# Patient Record
Sex: Male | Born: 1935 | Race: White | Hispanic: No | Marital: Married
Health system: Southern US, Community
[De-identification: ages and names within clinical notes are randomized; demographics above are authoritative.]

## PROBLEM LIST (undated history)

## (undated) DIAGNOSIS — E039 Hypothyroidism, unspecified: Secondary | ICD-10-CM

## (undated) DIAGNOSIS — Z951 Presence of aortocoronary bypass graft: Secondary | ICD-10-CM

## (undated) DIAGNOSIS — I4891 Unspecified atrial fibrillation: Secondary | ICD-10-CM

## (undated) DIAGNOSIS — K56609 Unspecified intestinal obstruction, unspecified as to partial versus complete obstruction: Secondary | ICD-10-CM

## (undated) DIAGNOSIS — I10 Essential (primary) hypertension: Secondary | ICD-10-CM

## (undated) DIAGNOSIS — I82409 Acute embolism and thrombosis of unspecified deep veins of unspecified lower extremity: Secondary | ICD-10-CM

## (undated) DIAGNOSIS — H3530 Unspecified macular degeneration: Secondary | ICD-10-CM

## (undated) HISTORY — PX: APPENDECTOMY: SHX54

## (undated) HISTORY — PX: CORONARY ARTERY BYPASS GRAFT: SHX141

---

## 2008-08-30 ENCOUNTER — Ambulatory Visit: Payer: Self-pay | Admitting: Diagnostic Radiology

## 2008-08-30 ENCOUNTER — Emergency Department (HOSPITAL_BASED_OUTPATIENT_CLINIC_OR_DEPARTMENT_OTHER): Admission: EM | Admit: 2008-08-30 | Discharge: 2008-08-30 | Payer: Self-pay | Admitting: Emergency Medicine

## 2010-04-21 LAB — DIFFERENTIAL
Lymphs Abs: 1.3 10*3/uL (ref 0.7–4.0)
Monocytes Absolute: 0.6 10*3/uL (ref 0.1–1.0)
Monocytes Relative: 8 % (ref 3–12)
Neutro Abs: 4.7 10*3/uL (ref 1.7–7.7)
Neutrophils Relative %: 70 % (ref 43–77)

## 2010-04-21 LAB — URINALYSIS, ROUTINE W REFLEX MICROSCOPIC
Bilirubin Urine: NEGATIVE
Glucose, UA: NEGATIVE mg/dL
Ketones, ur: NEGATIVE mg/dL
Leukocytes, UA: NEGATIVE
pH: 6 (ref 5.0–8.0)

## 2010-04-21 LAB — POCT B-TYPE NATRIURETIC PEPTIDE (BNP): B Natriuretic Peptide, POC: 127 pg/mL — ABNORMAL HIGH (ref 0–100)

## 2010-04-21 LAB — URINE MICROSCOPIC-ADD ON

## 2010-04-21 LAB — COMPREHENSIVE METABOLIC PANEL
ALT: 21 U/L (ref 0–53)
Albumin: 4 g/dL (ref 3.5–5.2)
BUN: 15 mg/dL (ref 6–23)
Calcium: 9.2 mg/dL (ref 8.4–10.5)
Glucose, Bld: 90 mg/dL (ref 70–99)
Potassium: 4.1 mEq/L (ref 3.5–5.1)
Sodium: 140 mEq/L (ref 135–145)
Total Protein: 7.1 g/dL (ref 6.0–8.3)

## 2010-04-21 LAB — CBC
Hemoglobin: 15.6 g/dL (ref 13.0–17.0)
MCHC: 33.2 g/dL (ref 30.0–36.0)
Platelets: 174 10*3/uL (ref 150–400)
RDW: 14 % (ref 11.5–15.5)

## 2010-04-21 LAB — POCT CARDIAC MARKERS
CKMB, poc: 1.4 ng/mL (ref 1.0–8.0)
Myoglobin, poc: 73.5 ng/mL (ref 12–200)
Myoglobin, poc: 83.5 ng/mL (ref 12–200)

## 2010-04-21 LAB — GLUCOSE, CAPILLARY: Glucose-Capillary: 102 mg/dL — ABNORMAL HIGH (ref 70–99)

## 2010-04-21 LAB — APTT: aPTT: 31 seconds (ref 24–37)

## 2010-04-21 LAB — PROTIME-INR: INR: 3.1 — ABNORMAL HIGH (ref 0.00–1.49)

## 2012-06-07 ENCOUNTER — Emergency Department (HOSPITAL_BASED_OUTPATIENT_CLINIC_OR_DEPARTMENT_OTHER): Payer: Medicare Other

## 2012-06-07 ENCOUNTER — Emergency Department (HOSPITAL_BASED_OUTPATIENT_CLINIC_OR_DEPARTMENT_OTHER)
Admission: EM | Admit: 2012-06-07 | Discharge: 2012-06-08 | Disposition: A | Discharge status: Home / Self Care | Payer: Medicare Other | Attending: Emergency Medicine | Admitting: Emergency Medicine

## 2012-06-07 ENCOUNTER — Encounter (HOSPITAL_BASED_OUTPATIENT_CLINIC_OR_DEPARTMENT_OTHER): Payer: Self-pay | Admitting: *Deleted

## 2012-06-07 DIAGNOSIS — I10 Essential (primary) hypertension: Secondary | ICD-10-CM | POA: Insufficient documentation

## 2012-06-07 DIAGNOSIS — Z951 Presence of aortocoronary bypass graft: Secondary | ICD-10-CM | POA: Insufficient documentation

## 2012-06-07 DIAGNOSIS — E039 Hypothyroidism, unspecified: Secondary | ICD-10-CM | POA: Insufficient documentation

## 2012-06-07 DIAGNOSIS — Y9289 Other specified places as the place of occurrence of the external cause: Secondary | ICD-10-CM | POA: Insufficient documentation

## 2012-06-07 DIAGNOSIS — S79919A Unspecified injury of unspecified hip, initial encounter: Secondary | ICD-10-CM | POA: Insufficient documentation

## 2012-06-07 DIAGNOSIS — Z79899 Other long term (current) drug therapy: Secondary | ICD-10-CM | POA: Insufficient documentation

## 2012-06-07 DIAGNOSIS — S99929A Unspecified injury of unspecified foot, initial encounter: Secondary | ICD-10-CM | POA: Insufficient documentation

## 2012-06-07 DIAGNOSIS — I4891 Unspecified atrial fibrillation: Secondary | ICD-10-CM | POA: Insufficient documentation

## 2012-06-07 DIAGNOSIS — Y9389 Activity, other specified: Secondary | ICD-10-CM | POA: Insufficient documentation

## 2012-06-07 DIAGNOSIS — Z7901 Long term (current) use of anticoagulants: Secondary | ICD-10-CM | POA: Insufficient documentation

## 2012-06-07 DIAGNOSIS — S8990XA Unspecified injury of unspecified lower leg, initial encounter: Secondary | ICD-10-CM | POA: Insufficient documentation

## 2012-06-07 DIAGNOSIS — Z86718 Personal history of other venous thrombosis and embolism: Secondary | ICD-10-CM | POA: Insufficient documentation

## 2012-06-07 DIAGNOSIS — Z8669 Personal history of other diseases of the nervous system and sense organs: Secondary | ICD-10-CM | POA: Insufficient documentation

## 2012-06-07 DIAGNOSIS — S3981XA Other specified injuries of abdomen, initial encounter: Secondary | ICD-10-CM | POA: Insufficient documentation

## 2012-06-07 DIAGNOSIS — T148XXA Other injury of unspecified body region, initial encounter: Secondary | ICD-10-CM | POA: Insufficient documentation

## 2012-06-07 DIAGNOSIS — W010XXA Fall on same level from slipping, tripping and stumbling without subsequent striking against object, initial encounter: Secondary | ICD-10-CM | POA: Insufficient documentation

## 2012-06-07 DIAGNOSIS — N289 Disorder of kidney and ureter, unspecified: Secondary | ICD-10-CM | POA: Insufficient documentation

## 2012-06-07 DIAGNOSIS — I498 Other specified cardiac arrhythmias: Secondary | ICD-10-CM | POA: Insufficient documentation

## 2012-06-07 DIAGNOSIS — Z87891 Personal history of nicotine dependence: Secondary | ICD-10-CM | POA: Insufficient documentation

## 2012-06-07 HISTORY — DX: Unspecified macular degeneration: H35.30

## 2012-06-07 HISTORY — DX: Unspecified atrial fibrillation: I48.91

## 2012-06-07 HISTORY — DX: Presence of aortocoronary bypass graft: Z95.1

## 2012-06-07 HISTORY — DX: Essential (primary) hypertension: I10

## 2012-06-07 HISTORY — DX: Hypothyroidism, unspecified: E03.9

## 2012-06-07 HISTORY — DX: Acute embolism and thrombosis of unspecified deep veins of unspecified lower extremity: I82.409

## 2012-06-07 LAB — CBC WITH DIFFERENTIAL/PLATELET
Basophils Relative: 1 % (ref 0–1)
Eosinophils Absolute: 0.1 10*3/uL (ref 0.0–0.7)
Hemoglobin: 14.6 g/dL (ref 13.0–17.0)
MCH: 31 pg (ref 26.0–34.0)
MCHC: 34.7 g/dL (ref 30.0–36.0)
Monocytes Relative: 10 % (ref 3–12)
Neutrophils Relative %: 68 % (ref 43–77)
RDW: 14.4 % (ref 11.5–15.5)

## 2012-06-07 NOTE — ED Notes (Signed)
Pt states that he fell yesterday. States he thinks he slipped and fell hurting his right hip. Denies loc with fall. States he hit his right hip area. brusing noted to right side of abdomen. C/o soreness to hip area. States that tonight around 7pm he had a "funny" feeling on the right side of his face. Denies any difficulty with speech or any weakness. States that it feels better at present. Neuro exam negative on assessment. Grips equal bilateral and speech clear.

## 2012-06-07 NOTE — ED Provider Notes (Signed)
History  This chart was scribed for Edward [place] Smitty Cords, MD by [name], ED Scribe. This patient was seen in room MH02/MH02 and the patient's care was started at 11:46 PM.  CSN: [id number]  Arrival date & time 06/07/12  2215    Chief Complaint  Patient presents with  . Fall    Patient is a 79 y.o. male presenting with fall. The history is provided by the patient. No language interpreter was used.  Fall This is a new problem. The current episode started 2 days ago. The problem occurs constantly. The problem has not changed since onset.Pertinent negatives include no chest pain, no abdominal pain, no headaches and no shortness of breath. Nothing aggravates the symptoms. Nothing relieves the symptoms. He has tried nothing for the symptoms. The treatment provided no relief.    HPI Comments: Edward Fitzpatrick is a 79 y.o. male who presents to the Emergency Department complaining of a fall that happened Friday afternoon. Pt states he landed on his right side and rolled back onto his left side and he states he also hit his head. Pt states the pain is worse from his knee to his hip. Pt denies fever, neck pain, sore throat, visual disturbance, CP, cough, SOB, abdominal pain, nausea, emesis, diarrhea, urinary symptoms, back pain, HA, weakness, numbness and rash as associated symptoms. Pt states he does not want pain medication right now.   Past Medical History  Diagnosis Date  . Atrial fibrillation   . S/P CABG x 1   . DVT (deep venous thrombosis)   . Hypertension   . Hypothyroid   . Macular degeneration     Past Surgical History  Procedure Laterality Date  . Coronary artery bypass graft    . Appendectomy      No family history on file.  History  Substance Use Topics  . Smoking status: Former Games developer  . Smokeless tobacco: Not on file  . Alcohol Use: Yes     Comment: occasional       Review of Systems  Constitutional: Negative for fever.  HENT: Negative for sore throat and  neck pain.   Eyes: Negative for visual disturbance.  Respiratory: Negative for cough and shortness of breath.   Cardiovascular: Negative for chest pain.  Gastrointestinal: Negative for nausea, vomiting, abdominal pain and diarrhea.  Musculoskeletal: Negative for back pain.  Skin: Negative for rash.  Neurological: Negative for weakness, numbness and headaches.  All other systems reviewed and are negative.    Allergies  Review of patient's allergies indicates not on file.  Home Medications   Current Outpatient Rx  Name  Route  Sig  Dispense  Refill  . amLODipine (NORVASC) 5 MG tablet   Oral   Take 5 mg by mouth daily.         . furosemide (LASIX) 20 MG tablet   Oral   Take 20 mg by mouth 2 (two) times daily.         Marland Kitchen levothyroxine (SYNTHROID, LEVOTHROID) 25 MCG tablet   Oral   Take 25 mcg by mouth daily before breakfast.         . omeprazole (PRILOSEC) 40 MG capsule   Oral   Take 40 mg by mouth daily.         Marland Kitchen warfarin (COUMADIN) 4 MG tablet   Oral   Take 4 mg by mouth daily.           BP 148/74  Pulse 71  Temp(Src) 97.9 F (36.6 C) (  Oral)  Resp 16  SpO2 98%  Physical Exam  Nursing note and vitals reviewed. Constitutional: He is oriented to person, place, and time. He appears well-developed and well-nourished.  HENT:  Head: Normocephalic and atraumatic. Head is without raccoon's eyes and without Battle's sign.  Right Ear: No mastoid tenderness. No hemotympanum.  Left Ear: No mastoid tenderness. No hemotympanum.  Mouth/Throat: Oropharynx is clear and moist.  Eyes: Pupils are equal, round, and reactive to light.  Lens implant in left eye.  Neck: Normal range of motion.  Cardiovascular: Normal rate, regular rhythm and intact distal pulses.   Bradycardia but regular.  Pulmonary/Chest: Effort normal and breath sounds normal. He has no wheezes. He has no rales.  Abdominal: Soft. Bowel sounds are normal. He exhibits no distension. There is no  tenderness. There is no rebound and no guarding.  Musculoskeletal: Normal range of motion. He exhibits no tenderness.  No C-spine tenderness. Bruising over iliac crest on the right. Intact distal pulses. No step offs. No crepitance. No bruising to the back.   Neurological: He is alert and oriented to person, place, and time. He has normal reflexes.  Skin: Skin is warm and dry.  Psychiatric: He has a normal mood and affect.    ED Course  Procedures (including critical care time)  DIAGNOSTIC STUDIES: Oxygen Saturation is 98% on RA, normal by my interpretation.    COORDINATION OF CARE: 12:02 AM-Discussed treatment plan with pt at bedside and pt agreed to plan.   Labs Reviewed  BASIC METABOLIC PANEL - Abnormal; Notable for the following:    Glucose, Bld 103 (*)    GFR calc non Af Amer 63 (*)    GFR calc Af Amer 73 (*)    All other components within normal limits  PROTIME-INR - Abnormal; Notable for the following:    Prothrombin Time 22.0 (*)    INR 2.01 (*)    All other components within normal limits  CBC WITH DIFFERENTIAL   Dg Pelvis 1-2 Views  06/08/2012   *RADIOLOGY REPORT*  Clinical Data: Status post fall; right hip pain.  PELVIS - 1-2 VIEW  Comparison: CTA of the abdomen and pelvis performed 08/30/2008  Findings: There is no evidence of fracture or dislocation.  Both femoral heads are seated normally within their respective acetabula.  Degenerative change is noted along the lower lumbar spine, and mild sclerotic change is seen at the sacroiliac joints.  The visualized bowel gas pattern is grossly unremarkable in appearance.  Scattered vascular calcifications are seen.  IMPRESSION:  1.  No evidence of fracture or dislocation. 2.  Scattered vascular calcifications seen.   Original Report Authenticated By: Tonia Ghent, M.D.   Ct Head Wo Contrast  06/08/2012   *RADIOLOGY REPORT*  Clinical Data:  Fall  CT HEAD WITHOUT CONTRAST CT CERVICAL SPINE WITHOUT CONTRAST  Technique:   Multidetector CT imaging of the head and cervical spine was performed following the standard protocol without intravenous contrast.  Multiplanar CT image reconstructions of the cervical spine were also generated.  Comparison:   None  CT HEAD  Findings:  No acute intracranial hemorrhage.  No focal mass lesion. No CT evidence of acute infarction.   No midline shift or mass effect.  No hydrocephalus.  Basilar cisterns are patent.  There is generalized cortical atrophy.  There is mild periventricular and deep white matter hypodensities.  Paranasal sinuses and mastoid air cells are clear.  Orbits are normal.  IMPRESSION:  1.  No acute intracranial trauma. 2.  Atrophy and microvascular disease.  CT CERVICAL SPINE  Findings: No prevertebral soft tissue swelling.  Normal alignment of cervical vertebral bodies.  No loss of vertebral body height. Normal facet articulation.  Normal craniocervical junction.  No evidence epidural or paraspinal hematoma.  There is mild endplate spurring and joint space narrowing.  IMPRESSION:  1.  No evidence of cervical spine fracture. 2.  Multilevel disc osteophytic disease .   Original Report Authenticated By: Genevive Bi, M.D.   Ct Cervical Spine Wo Contrast  06/08/2012   *RADIOLOGY REPORT*  Clinical Data:  Fall  CT HEAD WITHOUT CONTRAST CT CERVICAL SPINE WITHOUT CONTRAST  Technique:  Multidetector CT imaging of the head and cervical spine was performed following the standard protocol without intravenous contrast.  Multiplanar CT image reconstructions of the cervical spine were also generated.  Comparison:   None  CT HEAD  Findings:  No acute intracranial hemorrhage.  No focal mass lesion. No CT evidence of acute infarction.   No midline shift or mass effect.  No hydrocephalus.  Basilar cisterns are patent.  There is generalized cortical atrophy.  There is mild periventricular and deep white matter hypodensities.  Paranasal sinuses and mastoid air cells are clear.  Orbits are normal.   IMPRESSION:  1.  No acute intracranial trauma. 2.  Atrophy and microvascular disease.  CT CERVICAL SPINE  Findings: No prevertebral soft tissue swelling.  Normal alignment of cervical vertebral bodies.  No loss of vertebral body height. Normal facet articulation.  Normal craniocervical junction.  No evidence epidural or paraspinal hematoma.  There is mild endplate spurring and joint space narrowing.  IMPRESSION:  1.  No evidence of cervical spine fracture. 2.  Multilevel disc osteophytic disease .   Original Report Authenticated By: Genevive Bi, M.D.     No diagnosis found.    MDM  No trauma.  Findings of CT discussed with patient.  Patient will need to follow up with PMD for outpatient [place] to better identify renal lesion seen on CT scan Patient verbalizes understanding and agrees to follow up     I personally performed the services described in this documentation, which was scribed in my presence. The recorded information has been reviewed and is accurate.    Jasmine Awe, MD 06/08/12 [phone]

## 2012-06-08 ENCOUNTER — Encounter (HOSPITAL_BASED_OUTPATIENT_CLINIC_OR_DEPARTMENT_OTHER): Payer: Self-pay

## 2012-06-08 ENCOUNTER — Emergency Department (HOSPITAL_BASED_OUTPATIENT_CLINIC_OR_DEPARTMENT_OTHER): Payer: Medicare Other

## 2012-06-08 LAB — BASIC METABOLIC PANEL
BUN: 19 mg/dL (ref 6–23)
Creatinine, Ser: 1 mg/dL (ref 0.50–1.35)
GFR calc Af Amer: 73 mL/min — ABNORMAL LOW (ref >90)
GFR calc non Af Amer: 63 mL/min — ABNORMAL LOW (ref >90)
Potassium: 3.9 mEq/L (ref 3.5–5.1)

## 2012-06-08 MED ORDER — IOHEXOL 300 MG/ML  SOLN
100.0000 mL | Freq: Once | INTRAMUSCULAR | Status: AC | PRN
  Administered 2012-06-08: 100 mL via INTRAVENOUS

## 2012-07-08 ENCOUNTER — Emergency Department (HOSPITAL_BASED_OUTPATIENT_CLINIC_OR_DEPARTMENT_OTHER): Payer: Medicare Other

## 2012-07-08 ENCOUNTER — Emergency Department (HOSPITAL_BASED_OUTPATIENT_CLINIC_OR_DEPARTMENT_OTHER)
Admission: EM | Admit: 2012-07-08 | Discharge: 2012-07-08 | Disposition: A | Discharge status: Home / Self Care | Payer: Medicare Other | Attending: Emergency Medicine | Admitting: Emergency Medicine

## 2012-07-08 ENCOUNTER — Encounter (HOSPITAL_BASED_OUTPATIENT_CLINIC_OR_DEPARTMENT_OTHER): Payer: Self-pay | Admitting: *Deleted

## 2012-07-08 DIAGNOSIS — S40019A Contusion of unspecified shoulder, initial encounter: Secondary | ICD-10-CM | POA: Insufficient documentation

## 2012-07-08 DIAGNOSIS — Y9289 Other specified places as the place of occurrence of the external cause: Secondary | ICD-10-CM | POA: Insufficient documentation

## 2012-07-08 DIAGNOSIS — Z86718 Personal history of other venous thrombosis and embolism: Secondary | ICD-10-CM | POA: Insufficient documentation

## 2012-07-08 DIAGNOSIS — Z79899 Other long term (current) drug therapy: Secondary | ICD-10-CM | POA: Insufficient documentation

## 2012-07-08 DIAGNOSIS — Z7901 Long term (current) use of anticoagulants: Secondary | ICD-10-CM | POA: Insufficient documentation

## 2012-07-08 DIAGNOSIS — Y93B9 Activity, other involving muscle strengthening exercises: Secondary | ICD-10-CM | POA: Insufficient documentation

## 2012-07-08 DIAGNOSIS — Z87891 Personal history of nicotine dependence: Secondary | ICD-10-CM | POA: Insufficient documentation

## 2012-07-08 DIAGNOSIS — I10 Essential (primary) hypertension: Secondary | ICD-10-CM | POA: Insufficient documentation

## 2012-07-08 DIAGNOSIS — Z951 Presence of aortocoronary bypass graft: Secondary | ICD-10-CM | POA: Insufficient documentation

## 2012-07-08 DIAGNOSIS — E039 Hypothyroidism, unspecified: Secondary | ICD-10-CM | POA: Insufficient documentation

## 2012-07-08 DIAGNOSIS — Z8669 Personal history of other diseases of the nervous system and sense organs: Secondary | ICD-10-CM | POA: Insufficient documentation

## 2012-07-08 DIAGNOSIS — R296 Repeated falls: Secondary | ICD-10-CM | POA: Insufficient documentation

## 2012-07-08 DIAGNOSIS — I4891 Unspecified atrial fibrillation: Secondary | ICD-10-CM | POA: Insufficient documentation

## 2012-07-08 DIAGNOSIS — S40011A Contusion of right shoulder, initial encounter: Secondary | ICD-10-CM

## 2012-07-08 NOTE — ED Provider Notes (Signed)
History    CSN: [id number] Arrival date & time 07/08/12  7425  First MD Initiated Contact with Patient 07/08/12 1845     Chief Complaint  Patient presents with  . Fall   (Consider location/radiation/quality/duration/timing/severity/associated sxs/prior Treatment) HPI While doing balance exercises pt fell from standing position onto R shoulder. No head or neck injury. C/o mild pain at shoulder. No numbness or weakness. Pt last INR was 1.7 Past Medical History  Diagnosis Date  . Atrial fibrillation   . S/P CABG x 1   . DVT (deep venous thrombosis)   . Hypertension   . Hypothyroid   . Macular degeneration    Past Surgical History  Procedure Laterality Date  . Coronary artery bypass graft    . Appendectomy     History reviewed. No pertinent family history. History  Substance Use Topics  . Smoking status: Former Games developer  . Smokeless tobacco: Not on file  . Alcohol Use: Yes     Comment: occasional     Review of Systems  Constitutional: Negative for fever and chills.  HENT: Negative for neck pain and neck stiffness.   Respiratory: Negative for shortness of breath.   Cardiovascular: Negative for chest pain, palpitations and leg swelling.  Gastrointestinal: Negative for nausea, vomiting and abdominal pain.  Musculoskeletal: Positive for arthralgias. Negative for back pain.  Skin: Positive for wound. Negative for rash.  Neurological: Negative for dizziness, syncope, weakness, light-headedness, numbness and headaches.  All other systems reviewed and are negative.    Allergies  Review of patient's allergies indicates no known allergies.  Home Medications   Current Outpatient Rx  Name  Route  Sig  Dispense  Refill  . atorvastatin (LIPITOR) 20 MG tablet   Oral   Take 20 mg by mouth daily.         . valsartan-hydrochlorothiazide (DIOVAN-HCT) 160-25 MG per tablet   Oral   Take 1 tablet by mouth daily.         Marland Kitchen warfarin (COUMADIN) 1 MG tablet   Oral   Take 1 mg  by mouth as directed.         Marland Kitchen amLODipine (NORVASC) 5 MG tablet   Oral   Take 5 mg by mouth daily.         . furosemide (LASIX) 20 MG tablet   Oral   Take 20 mg by mouth 2 (two) times daily.         Marland Kitchen levothyroxine (SYNTHROID, LEVOTHROID) 25 MCG tablet   Oral   Take 25 mcg by mouth daily before breakfast.         . omeprazole (PRILOSEC) 40 MG capsule   Oral   Take 40 mg by mouth daily.         Marland Kitchen warfarin (COUMADIN) 4 MG tablet   Oral   Take 4 mg by mouth daily.          BP 177/76  Pulse 71  Temp(Src) 98.3 F (36.8 C) (Oral)  Resp 18  Ht 5' 7.5" (1.715 m)  Wt 185 lb (83.915 kg)  BMI 28.53 kg/m2  SpO2 100% Physical Exam  Nursing note and vitals reviewed. Constitutional: He is oriented to person, place, and time. He appears well-developed and well-nourished. No distress.  HENT:  Head: Normocephalic and atraumatic.  Mouth/Throat: Oropharynx is clear and moist.  Eyes: EOM are normal. Pupils are equal, round, and reactive to light.  Neck: Normal range of motion. Neck supple.  No posterior cervical TTP  Cardiovascular:  Normal rate and regular rhythm.   Pulmonary/Chest: Effort normal and breath sounds normal. No respiratory distress. He has no wheezes. He has no rales. He exhibits no tenderness.  Abdominal: Soft. Bowel sounds are normal. He exhibits no distension. There is no tenderness. There is no rebound and no guarding.  Musculoskeletal: Normal range of motion. He exhibits tenderness (Mild tenderness at R shoulder. No swelling or deformity. 2+ radial pulses. ). He exhibits no edema.  Neurological: He is alert and oriented to person, place, and time.  5/5 motor in all ext, sensation intact  Skin: Skin is warm and dry. No rash noted. No erythema.  Psychiatric: He has a normal mood and affect. His behavior is normal.    ED Course  Procedures (including critical care time) Labs Reviewed - No data to display Dg Shoulder Right  07/08/2012   *RADIOLOGY REPORT*   Clinical Data: 79 year old male right shoulder pain after fall. Decreased grip.  RIGHT SHOULDER - 2+ VIEW  Comparison: None.  Findings: No glenohumeral joint dislocation.  Proximal right humerus intact.  Right clavicle and scapula appear intact. Visualized right ribs and lung parenchyma within normal limits.  IMPRESSION: No acute fracture or dislocation identified about the right shoulder.   Original Report Authenticated By: Erskine Speed, M.D.   1. Shoulder contusion, right, initial encounter     MDM  Pt is certain he sustained no head or neck trauma. He c/o of no weakness numbness HA or neck pain.   Loren Racer, MD 07/08/12 2000

## 2012-07-08 NOTE — ED Notes (Signed)
[facility] called for transport

## 2012-07-08 NOTE — ED Notes (Signed)
Pt brought in by [facility] from [facility] , fall from standing [facility] on right shoulder

## 2013-03-02 ENCOUNTER — Emergency Department (HOSPITAL_BASED_OUTPATIENT_CLINIC_OR_DEPARTMENT_OTHER): Payer: Medicare PPO

## 2013-03-02 ENCOUNTER — Encounter (HOSPITAL_BASED_OUTPATIENT_CLINIC_OR_DEPARTMENT_OTHER): Payer: Self-pay | Admitting: Emergency Medicine

## 2013-03-02 ENCOUNTER — Emergency Department (HOSPITAL_BASED_OUTPATIENT_CLINIC_OR_DEPARTMENT_OTHER)
Admission: EM | Admit: 2013-03-02 | Discharge: 2013-03-02 | Disposition: A | Discharge status: Home / Self Care | Payer: Medicare PPO | Attending: Emergency Medicine | Admitting: Emergency Medicine

## 2013-03-02 DIAGNOSIS — I4891 Unspecified atrial fibrillation: Secondary | ICD-10-CM | POA: Insufficient documentation

## 2013-03-02 DIAGNOSIS — Z79899 Other long term (current) drug therapy: Secondary | ICD-10-CM | POA: Insufficient documentation

## 2013-03-02 DIAGNOSIS — Z87891 Personal history of nicotine dependence: Secondary | ICD-10-CM | POA: Insufficient documentation

## 2013-03-02 DIAGNOSIS — Z86718 Personal history of other venous thrombosis and embolism: Secondary | ICD-10-CM | POA: Insufficient documentation

## 2013-03-02 DIAGNOSIS — Z7901 Long term (current) use of anticoagulants: Secondary | ICD-10-CM | POA: Insufficient documentation

## 2013-03-02 DIAGNOSIS — Y9301 Activity, walking, marching and hiking: Secondary | ICD-10-CM | POA: Insufficient documentation

## 2013-03-02 DIAGNOSIS — S51812A Laceration without foreign body of left forearm, initial encounter: Secondary | ICD-10-CM

## 2013-03-02 DIAGNOSIS — W268XXA Contact with other sharp object(s), not elsewhere classified, initial encounter: Secondary | ICD-10-CM | POA: Insufficient documentation

## 2013-03-02 DIAGNOSIS — I10 Essential (primary) hypertension: Secondary | ICD-10-CM | POA: Insufficient documentation

## 2013-03-02 DIAGNOSIS — S60229A Contusion of unspecified hand, initial encounter: Secondary | ICD-10-CM | POA: Insufficient documentation

## 2013-03-02 DIAGNOSIS — S51809A Unspecified open wound of unspecified forearm, initial encounter: Secondary | ICD-10-CM | POA: Insufficient documentation

## 2013-03-02 DIAGNOSIS — Z951 Presence of aortocoronary bypass graft: Secondary | ICD-10-CM | POA: Insufficient documentation

## 2013-03-02 DIAGNOSIS — E039 Hypothyroidism, unspecified: Secondary | ICD-10-CM | POA: Insufficient documentation

## 2013-03-02 DIAGNOSIS — W1809XA Striking against other object with subsequent fall, initial encounter: Secondary | ICD-10-CM | POA: Insufficient documentation

## 2013-03-02 DIAGNOSIS — Z8669 Personal history of other diseases of the nervous system and sense organs: Secondary | ICD-10-CM | POA: Insufficient documentation

## 2013-03-02 DIAGNOSIS — S60221A Contusion of right hand, initial encounter: Secondary | ICD-10-CM

## 2013-03-02 DIAGNOSIS — Y929 Unspecified place or not applicable: Secondary | ICD-10-CM | POA: Insufficient documentation

## 2013-03-02 NOTE — ED Provider Notes (Signed)
CSN: [id number]     Arrival date & time 03/02/13  1617 History   First MD Initiated Contact with Patient 03/02/13 1633     Chief Complaint  Patient presents with  . fall right knee abrasion left wrist laceration      (Consider location/radiation/quality/duration/timing/severity/associated sxs/prior Treatment) Patient is a 80 y.o. male presenting with fall. The history is provided by the patient. No language interpreter was used.  Fall This is a new problem. The current episode started today. The problem occurs constantly. The problem has been unchanged. Pertinent negatives include no neck pain, numbness, visual change or vomiting. Nothing aggravates the symptoms. He has tried nothing for the symptoms. The treatment provided no relief.  Pt fell walking with grocery bags.   Pt hit his right hand on ground and cut his left hand over broken glass from the grocery bag.  No impact of head.   Pt also reports he has a scrap on right knee  Past Medical History  Diagnosis Date  . Atrial fibrillation   . S/P CABG x 1   . DVT (deep venous thrombosis)   . Hypertension   . Hypothyroid   . Macular degeneration    Past Surgical History  Procedure Laterality Date  . Coronary artery bypass graft    . Appendectomy     History reviewed. No pertinent family history. History  Substance Use Topics  . Smoking status: Former Games developermoker  . Smokeless tobacco: Not on file  . Alcohol Use: Yes     Comment: occasional     Review of Systems  Gastrointestinal: Negative for vomiting.  Musculoskeletal: Negative for neck pain.  Skin: Positive for wound.  Neurological: Negative for numbness.  All other systems reviewed and are negative.      Allergies  Review of patient's allergies indicates no known allergies.  Home Medications   Current Outpatient Rx  Name  Route  Sig  Dispense  Refill  . amLODipine (NORVASC) 5 MG tablet   Oral   Take 5 mg by mouth daily.         Marland Kitchen. atorvastatin (LIPITOR) 20 MG  tablet   Oral   Take 20 mg by mouth daily.         . furosemide (LASIX) 20 MG tablet   Oral   Take 20 mg by mouth 2 (two) times daily.         Marland Kitchen. levothyroxine (SYNTHROID, LEVOTHROID) 25 MCG tablet   Oral   Take 25 mcg by mouth daily before breakfast.         . omeprazole (PRILOSEC) 40 MG capsule   Oral   Take 40 mg by mouth daily.         . valsartan-hydrochlorothiazide (DIOVAN-HCT) 160-25 MG per tablet   Oral   Take 1 tablet by mouth daily.         Marland Kitchen. warfarin (COUMADIN) 1 MG tablet   Oral   Take 1 mg by mouth as directed.         . warfarin (COUMADIN) 4 MG tablet   Oral   Take 4 mg by mouth daily.          BP 145/74  Pulse 59  Temp(Src) 98.2 F (36.8 C) (Oral)  Resp 14  SpO2 98% Physical Exam  Nursing note and vitals reviewed. Constitutional: He is oriented to person, place, and time. He appears well-developed and well-nourished.  HENT:  Head: Normocephalic.  Eyes: EOM are normal.  Neck: Normal range of motion.  Pulmonary/Chest: Effort normal.  Abdominal: He exhibits no distension.  Musculoskeletal: Normal range of motion.  Neurological: He is alert and oriented to person, place, and time.  Skin: Skin is warm.  Laceration left forearm.  3cm at wrist  3cm area right forearm.   Abrasion right knee   Bruised area right dorsal hand  Psychiatric: He has a normal mood and affect.    ED Course  LACERATION REPAIR Date/Time: 03/02/2013 10:03 PM Performed by: Elson Areas Authorized by: Elson Areas Consent: Verbal consent not obtained. Risks and benefits: risks, benefits and alternatives were discussed Consent given by: patient Patient identity confirmed: verbally with patient Laceration length: 3 cm Vascular damage: no Preparation: Patient was prepped and draped in the usual sterile fashion. Irrigation solution: saline Debridement: none Degree of undermining: none Skin closure: glue Technique: simple Patient tolerance: Patient tolerated  the procedure well with no immediate complications.   (including critical care time) Labs Review Labs Reviewed - No data to display Imaging Review Dg Forearm Left  03/02/2013   CLINICAL DATA:  Larey Seat. Left forearm pain. Next field  EXAM: LEFT FOREARM - 2 VIEW  COMPARISON:  None.  FINDINGS: Moderate degenerative changes noted at the wrist and elbow. No acute fracture of the forearm is identified. Widening of the scapholunate joint space could be due to ligament tear or insufficiency. Extensive vascular calcifications are noted.  IMPRESSION: No acute fracture.   Electronically Signed   By: Loralie Champagne M.D.   On: 03/02/2013 17:30    EKG Interpretation   None       MDM   Final diagnoses:  Contusion of right hand  Laceration of left forearm        Elson Areas, PA-C 03/02/13 2204

## 2013-03-02 NOTE — ED Notes (Signed)
Walking down driveway with 2 bags of groceries and tripped fell to knees then to chest cut left wrist on some glass he was carrying at the time

## 2013-03-02 NOTE — ED Notes (Signed)
Gauze dressing applied over Dermabond on left wrist to protect it.  Pt educated on keeping are dry and dermabond care.

## 2013-03-02 NOTE — Discharge Instructions (Signed)
Contusion A contusion is a deep bruise. Contusions are the result of an injury that caused bleeding under the skin. The contusion may turn blue, purple, or yellow. Minor injuries will give you a painless contusion, but more severe contusions may stay painful and swollen for a few weeks.  CAUSES  A contusion is usually caused by a blow, trauma, or direct force to an area of the body. SYMPTOMS   Swelling and redness of the injured area.  Bruising of the injured area.  Tenderness and soreness of the injured area.  Pain. DIAGNOSIS  The diagnosis can be made by taking a history and physical exam. An X-ray, CT scan, or MRI may be needed to determine if there were any associated injuries, such as fractures. TREATMENT  Specific treatment will depend on what area of the body was injured. In general, the best treatment for a contusion is resting, icing, elevating, and applying cold compresses to the injured area. Over-the-counter medicines may also be recommended for pain control. Ask your caregiver what the best treatment is for your contusion. HOME CARE INSTRUCTIONS   Put ice on the injured area.  Put ice in a plastic bag.  Place a towel between your skin and the bag.  Leave the ice on for 15-20 minutes, 03-04 times a day.  Only take over-the-counter or prescription medicines for pain, discomfort, or fever as directed by your caregiver. Your caregiver may recommend avoiding anti-inflammatory medicines (aspirin, ibuprofen, and naproxen) for 48 hours because these medicines may increase bruising.  Rest the injured area.  If possible, elevate the injured area to reduce swelling. SEEK IMMEDIATE MEDICAL CARE IF:   You have increased bruising or swelling.  You have pain that is getting worse.  Your swelling or pain is not relieved with medicines. MAKE SURE YOU:   Understand these instructions.  Will watch your condition.  Will get help right away if you are not doing well or get  worse. Document Released: 10/10/2004 Document Revised: 03/25/2011 Document Reviewed: 11/05/2010 [facility] Patient Information 2014 [place], [place]. Laceration Care, Adult A laceration is a cut or lesion that goes through all layers of the skin and into the tissue just beneath the skin. TREATMENT  Some lacerations may not require closure. Some lacerations may not be able to be closed due to an increased risk of infection. It is important to see your caregiver as soon as possible after an injury to minimize the risk of infection and maximize the opportunity for successful closure. If closure is appropriate, pain medicines may be given, if needed. The wound will be cleaned to help prevent infection. Your caregiver will use stitches (sutures), staples, wound glue (adhesive), or skin adhesive strips to repair the laceration. These tools bring the skin edges together to allow for faster healing and a better cosmetic outcome. However, all wounds will heal with a scar. Once the wound has healed, scarring can be minimized by covering the wound with sunscreen during the day for 1 full year. HOME CARE INSTRUCTIONS  For sutures or staples:  Keep the wound clean and dry.  If you were given a bandage (dressing), you should change it at least once a day. Also, change the dressing if it becomes wet or dirty, or as directed by your caregiver.  Wash the wound with soap and water 2 times a day. Rinse the wound off with water to remove all soap. Pat the wound dry with a clean towel.  After cleaning, apply a thin layer of the  antibiotic ointment as recommended by your caregiver. This will help prevent infection and keep the dressing from sticking.  You may shower as usual after the first 24 hours. Do not soak the wound in water until the sutures are removed.  Only take over-the-counter or prescription medicines for pain, discomfort, or fever as directed by your caregiver.  Get your sutures or staples removed as  directed by your caregiver. For skin adhesive strips:  Keep the wound clean and dry.  Do not get the skin adhesive strips wet. You may bathe carefully, using caution to keep the wound dry.  If the wound gets wet, pat it dry with a clean towel.  Skin adhesive strips will fall off on their own. You may trim the strips as the wound heals. Do not remove skin adhesive strips that are still stuck to the wound. They will fall off in time. For wound adhesive:  You may briefly wet your wound in the shower or bath. Do not soak or scrub the wound. Do not swim. Avoid periods of heavy perspiration until the skin adhesive has fallen off on its own. After showering or bathing, gently pat the wound dry with a clean towel.  Do not apply liquid medicine, cream medicine, or ointment medicine to your wound while the skin adhesive is in place. This may loosen the film before your wound is healed.  If a dressing is placed over the wound, be careful not to apply tape directly over the skin adhesive. This may cause the adhesive to be pulled off before the wound is healed.  Avoid prolonged exposure to sunlight or tanning lamps while the skin adhesive is in place. Exposure to ultraviolet light in the first year will darken the scar.  The skin adhesive will usually remain in place for 5 to 10 days, then naturally fall off the skin. Do not pick at the adhesive film. You may need a tetanus shot if:  You cannot remember when you had your last tetanus shot.  You have never had a tetanus shot. If you get a tetanus shot, your arm may swell, get red, and feel warm to the touch. This is common and not a problem. If you need a tetanus shot and you choose not to have one, there is a rare chance of getting tetanus. Sickness from tetanus can be serious. SEEK MEDICAL CARE IF:   You have redness, swelling, or increasing pain in the wound.  You see a red line that goes away from the wound.  You have yellowish-white fluid  (pus) coming from the wound.  You have a fever.  You notice a bad smell coming from the wound or dressing.  Your wound breaks open before or after sutures have been removed.  You notice something coming out of the wound such as wood or glass.  Your wound is on your hand or foot and you cannot move a finger or toe. SEEK IMMEDIATE MEDICAL CARE IF:   Your pain is not controlled with prescribed medicine.  You have severe swelling around the wound causing pain and numbness or a change in color in your arm, hand, leg, or foot.  Your wound splits open and starts bleeding.  You have worsening numbness, weakness, or loss of function of any joint around or beyond the wound.  You develop painful lumps near the wound or on the skin anywhere on your body. MAKE SURE YOU:   Understand these instructions.  Will watch your condition.  Will get  help right away if you are not doing well or get worse. Document Released: 12/31/2004 Document Revised: 03/25/2011 Document Reviewed: 06/26/2010 [facility] Patient Information 2014 [place], [place].

## 2013-03-03 NOTE — ED Provider Notes (Signed)
80 y.o. Male fell while carrying groceries.  Left hand laceration on glass from broken groceries.  Laceration repaired by Langston MaskerKaren Sofia, PAC   I performed a history and physical examination of Edward LoseGeorge Fitzpatrick and discussed his management with Ms. Sofia.  I agree with the history, physical, assessment, and plan of care, with the following exceptions: None  I was present for the following procedures: None Time Spent in Critical Care of the patient: None Time spent in discussions with the patient and family: 5  Edward Fitzpatrick    Edward Birch S Blease Capaldi, MD 03/03/13 1335

## 2013-04-24 ENCOUNTER — Emergency Department (HOSPITAL_BASED_OUTPATIENT_CLINIC_OR_DEPARTMENT_OTHER)
Admission: EM | Admit: 2013-04-24 | Discharge: 2013-04-24 | Disposition: A | Discharge status: Home / Self Care | Payer: Medicare PPO | Attending: Emergency Medicine | Admitting: Emergency Medicine

## 2013-04-24 ENCOUNTER — Encounter (HOSPITAL_BASED_OUTPATIENT_CLINIC_OR_DEPARTMENT_OTHER): Payer: Self-pay | Admitting: Emergency Medicine

## 2013-04-24 DIAGNOSIS — I10 Essential (primary) hypertension: Secondary | ICD-10-CM | POA: Insufficient documentation

## 2013-04-24 DIAGNOSIS — R42 Dizziness and giddiness: Secondary | ICD-10-CM

## 2013-04-24 DIAGNOSIS — Z8669 Personal history of other diseases of the nervous system and sense organs: Secondary | ICD-10-CM | POA: Insufficient documentation

## 2013-04-24 DIAGNOSIS — Z79899 Other long term (current) drug therapy: Secondary | ICD-10-CM | POA: Insufficient documentation

## 2013-04-24 DIAGNOSIS — Z951 Presence of aortocoronary bypass graft: Secondary | ICD-10-CM | POA: Insufficient documentation

## 2013-04-24 DIAGNOSIS — Z7901 Long term (current) use of anticoagulants: Secondary | ICD-10-CM | POA: Insufficient documentation

## 2013-04-24 DIAGNOSIS — I4891 Unspecified atrial fibrillation: Secondary | ICD-10-CM | POA: Insufficient documentation

## 2013-04-24 DIAGNOSIS — Z86718 Personal history of other venous thrombosis and embolism: Secondary | ICD-10-CM | POA: Insufficient documentation

## 2013-04-24 DIAGNOSIS — M25519 Pain in unspecified shoulder: Secondary | ICD-10-CM | POA: Insufficient documentation

## 2013-04-24 DIAGNOSIS — Z87891 Personal history of nicotine dependence: Secondary | ICD-10-CM | POA: Insufficient documentation

## 2013-04-24 DIAGNOSIS — E039 Hypothyroidism, unspecified: Secondary | ICD-10-CM | POA: Insufficient documentation

## 2013-04-24 LAB — CBC
HCT: 42.7 % (ref 39.0–52.0)
Hemoglobin: 14.5 g/dL (ref 13.0–17.0)
MCH: 31.3 pg (ref 26.0–34.0)
MCHC: 34 g/dL (ref 30.0–36.0)
MCV: 92 fL (ref 78.0–100.0)
Platelets: 159 K/uL (ref 150–400)
RBC: 4.64 MIL/uL (ref 4.22–5.81)
RDW: 14.5 % (ref 11.5–15.5)
WBC: 6.4 10*3/uL (ref 4.0–10.5)

## 2013-04-24 LAB — BASIC METABOLIC PANEL
BUN: 18 mg/dL (ref 6–23)
CO2: 25 mEq/L (ref 19–32)
Chloride: 106 mEq/L (ref 96–112)
Glucose, Bld: 106 mg/dL — ABNORMAL HIGH (ref 70–99)
Potassium: 3.9 mEq/L (ref 3.7–5.3)

## 2013-04-24 LAB — PROTIME-INR
INR: 2.63 — ABNORMAL HIGH (ref 0.00–1.49)
Prothrombin Time: 27.2 s — ABNORMAL HIGH (ref 11.6–15.2)

## 2013-04-24 LAB — BASIC METABOLIC PANEL WITH GFR
Calcium: 9.1 mg/dL (ref 8.4–10.5)
Creatinine, Ser: 1 mg/dL (ref 0.50–1.35)
GFR calc Af Amer: 72 mL/min — ABNORMAL LOW (ref >90)
GFR calc non Af Amer: 62 mL/min — ABNORMAL LOW (ref >90)
Sodium: 143 meq/L (ref 137–147)

## 2013-04-24 NOTE — ED Notes (Signed)
Pt ambulated in the hall.  Pt slightly unbalance when he hurries.  Tolerated ambulation well, uses cane.  Encouraged pt to take his time with ambulation and to keep his pace slow.  Pt gave verbal understanding.

## 2013-04-24 NOTE — ED Provider Notes (Signed)
CSN: [id number]     Arrival date & time 04/24/13  [id number] History   First MD Initiated Contact with Patient 04/24/13 0845     Chief Complaint  Patient presents with  . took extra coumadin dose   . Dizziness     (Consider location/radiation/quality/duration/timing/severity/associated sxs/prior Treatment) HPI Comments: Pt reports was supposed to reduce his 5 mg coumadin down to 2.5 for 2 days, then take 5 mg again after that, his last coumadin level was 2.8.  He accidentally took 7 mg last night.  Upon waking, he was lightheaded and staggered when trying to walk this AM.  Had a normal BM today, no blood.  No recent fever, no N/V, no abd pain, back pain, CP, SOB.  He has known right shoulder arthritis and can't raise very high.  He walked a good distacne yesterday with no difficulty and had a light dinner, no breakfast yet this AM.    Patient is a 80 y.o. male presenting with dizziness. The history is provided by the patient and the EMS personnel.  Dizziness Quality:  Imbalance Severity:  Moderate Onset quality:  Sudden Duration:  1 hour Timing:  Constant Progression:  Unchanged Chronicity:  New Context: standing up   Relieved by:  Lying down Worsened by:  Standing up (walking) Associated symptoms: no blood in stool, no chest pain, no diarrhea, no headaches, no nausea, no shortness of breath, no vision changes, no vomiting and no weakness     Past Medical History  Diagnosis Date  . Atrial fibrillation   . S/P CABG x 1   . DVT (deep venous thrombosis)   . Hypertension   . Hypothyroid   . Macular degeneration    Past Surgical History  Procedure Laterality Date  . Coronary artery bypass graft    . Appendectomy     History reviewed. No pertinent family history. History  Substance Use Topics  . Smoking status: Former Games developermoker  . Smokeless tobacco: Not on file  . Alcohol Use: Yes     Comment: occasional     Review of Systems  Constitutional: Negative for fever and chills.   HENT: Negative for congestion.   Respiratory: Negative for cough, chest tightness and shortness of breath.   Cardiovascular: Negative for chest pain.  Gastrointestinal: Negative for nausea, vomiting, abdominal pain, diarrhea and blood in stool.  Musculoskeletal: Positive for arthralgias. Negative for back pain, neck pain and neck stiffness.  Neurological: Positive for dizziness and light-headedness. Negative for syncope, weakness, numbness and headaches.  All other systems reviewed and are negative.     Allergies  Review of patient's allergies indicates no known allergies.  Home Medications   Current Outpatient Rx  Name  Route  Sig  Dispense  Refill  . warfarin (COUMADIN) 2.5 MG tablet   Oral   Take 2.5 mg by mouth daily.         Marland Kitchen. amLODipine (NORVASC) 5 MG tablet   Oral   Take 5 mg by mouth daily.         Marland Kitchen. atorvastatin (LIPITOR) 20 MG tablet   Oral   Take 20 mg by mouth daily.         . furosemide (LASIX) 20 MG tablet   Oral   Take 20 mg by mouth 2 (two) times daily.         Marland Kitchen. levothyroxine (SYNTHROID, LEVOTHROID) 25 MCG tablet   Oral   Take 25 mcg by mouth daily before breakfast.         .  omeprazole (PRILOSEC) 40 MG capsule   Oral   Take 40 mg by mouth daily.         . valsartan-hydrochlorothiazide (DIOVAN-HCT) 160-25 MG per tablet   Oral   Take 1 tablet by mouth daily.          BP 137/95  Pulse 84  Temp(Src) 97.7 F (36.5 C) (Oral)  Resp 18  SpO2 95% Physical Exam  Nursing note and vitals reviewed. Constitutional: He is oriented to person, place, and time. He appears well-developed and well-nourished. No distress.  HENT:  Head: Normocephalic and atraumatic.  Eyes: Conjunctivae are normal. No scleral icterus.  No nystagmus  Neck: Normal range of motion. Neck supple.  Cardiovascular: Normal rate, regular rhythm and intact distal pulses.   Pulmonary/Chest: Effort normal. No respiratory distress. He has no wheezes.  Abdominal: Soft. He  exhibits no distension. There is no tenderness. There is no rebound.  Neurological: He is alert and oriented to person, place, and time. He exhibits normal muscle tone. Coordination normal.  Given limitation with right shoulder, 5/5 grip strength, B strength in both ankles, intact sensation.  CN 2-12 grossly intact.  Normal finger to nose with both arms given limited ROM of shoulder on right  Skin: Skin is warm and dry. No rash noted. He is not diaphoretic.    ED Course  Procedures (including critical care time) Labs Review Labs Reviewed  BASIC METABOLIC PANEL - Abnormal; Notable for the following:    Glucose, Bld 106 (*)    GFR calc non Af Amer 62 (*)    GFR calc Af Amer 72 (*)    All other components within normal limits  PROTIME-INR - Abnormal; Notable for the following:    Prothrombin Time 27.2 (*)    INR 2.63 (*)    All other components within normal limits  CBC   Imaging Review No results found.   EKG Interpretation   Date/Time:  Saturday April 24 2013 09:02:08 EDT Ventricular Rate:  76 PR Interval:    QRS Duration: 106 QT Interval:  404 QTC Calculation: 454 R Axis:   -6 Text Interpretation:  Atrial fibrillation with premature ventricular or  aberrantly conducted complexes Abnormal ECG No significant change since  last tracing PVC's are new Confirmed by [facility]  MD, MICHEAL ([id number]) on  04/24/2013 9:33:57 AM     RA sat is 95% and I interpret to be adequate  10:56 AM Pt was feeling improved.  Orthostatics were slightly positive with drop in SBP and rise in HR between laying and sitting/standing.  No focal deficits however.  Blood counts are ok, INR was 2.6 and this was discussed with pt to have INR rechecked next week and to watch for signs of bleeding or bruising heavily.  No HAm CP, SOB.  Will d/c home.    MDM   Final diagnoses:  Postural dizziness   Pt told taking an extra 5 mg last night would likely not be affecting his balance today.  Will evaluate for  orthostatic hypotension, anemia, hypotension, arrythmia.  Pt is not in acute distress.  No focal findings to suggest CVA at this time.      Gavin Pound. Velina Drollinger, MD 04/24/13 1157

## 2013-04-24 NOTE — ED Notes (Signed)
Patient reports that last pm took coumadin 7mg  instead of 2.5 mg. Just had INR checked and last pm was the first dose of 2.5mg  instead of his previous normal daily dose of 5mg . Patient resides at [facility] and they suggested he come for evaluation. Patient concerned that after awakening this am he became dizzy. On exam no neuro deficits, alert and oriented.

## 2013-04-24 NOTE — ED Notes (Signed)
Pt asked for something to eat and drink and chose coffee with cream and sugar and cheerios with milk.

## 2013-04-24 NOTE — ED Notes (Signed)
Patient drinking orange juice.  

## 2013-04-24 NOTE — Discharge Instructions (Signed)

## 2013-07-04 ENCOUNTER — Emergency Department (HOSPITAL_BASED_OUTPATIENT_CLINIC_OR_DEPARTMENT_OTHER): Payer: Medicare PPO

## 2013-07-04 ENCOUNTER — Emergency Department (HOSPITAL_BASED_OUTPATIENT_CLINIC_OR_DEPARTMENT_OTHER)
Admission: EM | Admit: 2013-07-04 | Discharge: 2013-07-04 | Discharge status: Against Medical Advice | Payer: Medicare PPO | Attending: Emergency Medicine | Admitting: Emergency Medicine

## 2013-07-04 ENCOUNTER — Encounter (HOSPITAL_BASED_OUTPATIENT_CLINIC_OR_DEPARTMENT_OTHER): Payer: Self-pay | Admitting: Emergency Medicine

## 2013-07-04 DIAGNOSIS — Z79899 Other long term (current) drug therapy: Secondary | ICD-10-CM | POA: Insufficient documentation

## 2013-07-04 DIAGNOSIS — I10 Essential (primary) hypertension: Secondary | ICD-10-CM | POA: Insufficient documentation

## 2013-07-04 DIAGNOSIS — Z86718 Personal history of other venous thrombosis and embolism: Secondary | ICD-10-CM | POA: Insufficient documentation

## 2013-07-04 DIAGNOSIS — R55 Syncope and collapse: Secondary | ICD-10-CM | POA: Insufficient documentation

## 2013-07-04 DIAGNOSIS — R079 Chest pain, unspecified: Secondary | ICD-10-CM | POA: Insufficient documentation

## 2013-07-04 DIAGNOSIS — I4891 Unspecified atrial fibrillation: Secondary | ICD-10-CM | POA: Insufficient documentation

## 2013-07-04 DIAGNOSIS — Z951 Presence of aortocoronary bypass graft: Secondary | ICD-10-CM | POA: Insufficient documentation

## 2013-07-04 DIAGNOSIS — I499 Cardiac arrhythmia, unspecified: Secondary | ICD-10-CM | POA: Insufficient documentation

## 2013-07-04 DIAGNOSIS — Z87891 Personal history of nicotine dependence: Secondary | ICD-10-CM | POA: Insufficient documentation

## 2013-07-04 DIAGNOSIS — Z7901 Long term (current) use of anticoagulants: Secondary | ICD-10-CM | POA: Insufficient documentation

## 2013-07-04 DIAGNOSIS — Z8669 Personal history of other diseases of the nervous system and sense organs: Secondary | ICD-10-CM | POA: Insufficient documentation

## 2013-07-04 DIAGNOSIS — E039 Hypothyroidism, unspecified: Secondary | ICD-10-CM | POA: Insufficient documentation

## 2013-07-04 LAB — CBC WITH DIFFERENTIAL/PLATELET
BASOS ABS: 0 10*3/uL (ref 0.0–0.1)
BASOS PCT: 0 % (ref 0–1)
EOS ABS: 0.1 10*3/uL (ref 0.0–0.7)
Eosinophils Relative: 1 % (ref 0–5)
HCT: 42 % (ref 39.0–52.0)
Hemoglobin: 14.3 g/dL (ref 13.0–17.0)
Lymphocytes Relative: 18 % (ref 12–46)
Lymphs Abs: 1.2 10*3/uL (ref 0.7–4.0)
MCH: 31.4 pg (ref 26.0–34.0)
MCHC: 34 g/dL (ref 30.0–36.0)
MCV: 92.3 fL (ref 78.0–100.0)
MONOS PCT: 10 % (ref 3–12)
Monocytes Absolute: 0.7 10*3/uL (ref 0.1–1.0)
NEUTROS ABS: 4.9 10*3/uL (ref 1.7–7.7)
NEUTROS PCT: 71 % (ref 43–77)
PLATELETS: 153 10*3/uL (ref 150–400)
RBC: 4.55 MIL/uL (ref 4.22–5.81)
RDW: 14.7 % (ref 11.5–15.5)
WBC: 6.9 10*3/uL (ref 4.0–10.5)

## 2013-07-04 LAB — BASIC METABOLIC PANEL
BUN: 22 mg/dL (ref 6–23)
CHLORIDE: 103 meq/L (ref 96–112)
CO2: 25 mEq/L (ref 19–32)
Calcium: 9.2 mg/dL (ref 8.4–10.5)
Creatinine, Ser: 1.1 mg/dL (ref 0.50–1.35)
GFR, EST AFRICAN AMERICAN: 64 mL/min — AB (ref >90)
GFR, EST NON AFRICAN AMERICAN: 55 mL/min — AB (ref >90)
Glucose, Bld: 102 mg/dL — ABNORMAL HIGH (ref 70–99)
POTASSIUM: 4.5 meq/L (ref 3.7–5.3)
SODIUM: 140 meq/L (ref 137–147)

## 2013-07-04 LAB — TROPONIN I

## 2013-07-04 LAB — PROTIME-INR
INR: 1.27 (ref 0.00–1.49)
Prothrombin Time: 15.6 seconds — ABNORMAL HIGH (ref 11.6–15.2)

## 2013-07-04 MED ORDER — IOHEXOL 350 MG/ML SOLN
100.0000 mL | Freq: Once | INTRAVENOUS | Status: AC | PRN
  Administered 2013-07-04: 100 mL via INTRAVENOUS

## 2013-07-04 NOTE — ED Notes (Signed)
Patient transported to CT via stretcher per tech.  Pt assisted with urinal use.

## 2013-07-04 NOTE — Discharge Instructions (Signed)
Chest Pain (Nonspecific) You are leaving AGAINST MEDICAL ADVICE. You may be at risk for having a heart attack or death. Return to the ED if you wish to be reevaluated. It is often hard to give a diagnosis for the cause of chest pain. There is always a chance that your pain could be related to something serious, such as a heart attack or a blood clot in the lungs. You need to follow up with your doctor. HOME CARE  If antibiotic medicine was given, take it as directed by your doctor. Finish the medicine even if you start to feel better.  For the next few days, avoid activities that bring on chest pain. Continue physical activities as told by your doctor.  Do not use any tobacco products. This includes cigarettes, chewing tobacco, and e-cigarettes.  Avoid drinking alcohol.  Only take medicine as told by your doctor.  Follow your doctor's suggestions for more testing if your chest pain does not go away.  Keep all doctor visits you made. GET HELP IF:  Your chest pain does not go away, even after treatment.  You have a rash with blisters on your chest.  You have a fever. GET HELP RIGHT AWAY IF:   You have more pain or pain that spreads to your arm, neck, jaw, back, or belly (abdomen).  You have shortness of breath.  You cough more than usual or cough up blood.  You have very bad back or belly pain.  You feel sick to your stomach (nauseous) or throw up (vomit).  You have very bad weakness.  You pass out (faint).  You have chills. This is an emergency. Do not wait to see if the problems will go away. Call your local emergency services (911 in [place] Do not drive yourself to the hospital. MAKE SURE YOU:   Understand these instructions.  Will watch your condition.  Will get help right away if you are not doing well or get worse. Document Released: 06/19/2007 Document Revised: 01/05/2013 Document Reviewed: 06/19/2007 [facility] Patient Information 2015 [place], [place]. This  information is not intended to replace advice given to you by your health care provider. Make sure you discuss any questions you have with your health care provider.

## 2013-07-04 NOTE — ED Notes (Signed)
Report to Dail, Charity fundraiserN, [place].

## 2013-07-04 NOTE — ED Notes (Signed)
Pt also sts that his wife had a stroke 16 days ago.

## 2013-07-04 NOTE — ED Notes (Signed)
Patient transported to X-ray and CT via stretcher per tech. 

## 2013-07-04 NOTE — ED Notes (Signed)
Assumed care of patient from Michelle, RN

## 2013-07-04 NOTE — ED Provider Notes (Addendum)
CSN: [id number]     Arrival date & time 07/04/13  2020 History  This chart was scribed for Edward OctaveStephen Rancour, MD by [name], ED scribe.  This patient was seen in room MH12/MH12 and the patient's care was started at 8:57 PM.   Chief Complaint  Patient presents with  . Chest Pain    The history is provided by the patient. No language interpreter was used.    HPI Comments: Edward Fitzpatrick is a 80 y.o. Male with history of atrial fibrillation and triple bypass who presents to the Emergency Department complaining of chest pain onset earlier today that lasted about 45 minutes. Patient states that he was on the phone with his family when he felt like he was going to pass out and fall out of his chair.  Patient says that he also developed pain in his left chest and "down my esophagus."  Patient states that his wife had a stroke 16 days ago and he has been walking 15 minutes each way every day to see her.  His pain today began after getting home from one of these walks.  Patient says that he did not feel any chest pain when he was walking, only when he was sitting.  Pain is non-radiating.  Patient says that passing gas made the chest pain better.  Patient states that he has associated headache and sinus pressure onset at the time of the chest pain. Patient reports that he had back pain earlier but it has since resolved. Patient denies SOB, abdominal pain, dizziness, or lightheadedness. Patient states that he then called the nurse's station, where his BP was taken and had a BP of 160/80. Patient says that he is on BP medication, which he takes everyday. Patient denies falling or hitting his head.  PCP is Dr. Lowell GuitarPowell    Past Medical History  Diagnosis Date  . Atrial fibrillation   . S/P CABG x 1   . DVT (deep venous thrombosis)   . Hypertension   . Hypothyroid   . Macular degeneration     Past Surgical History  Procedure Laterality Date  . Coronary artery bypass graft    . Appendectomy       No family history on file.   History  Substance Use Topics  . Smoking status: Former Games developermoker  . Smokeless tobacco: Not on file  . Alcohol Use: Yes     Comment: occasional      Review of Systems A complete 10 system review of systems was obtained and all systems are negative except as noted in the HPI and PMH.     Allergies  Review of patient's allergies indicates no known allergies.  Home Medications   Prior to Admission medications   Medication Sig Start Date End Date Taking? Authorizing Provider  amLODipine (NORVASC) 5 MG tablet Take 5 mg by mouth daily.    Historical Provider, MD  atorvastatin (LIPITOR) 20 MG tablet Take 20 mg by mouth daily.    Historical Provider, MD  furosemide (LASIX) 20 MG tablet Take 20 mg by mouth 2 (two) times daily.    Historical Provider, MD  levothyroxine (SYNTHROID, LEVOTHROID) 25 MCG tablet Take 25 mcg by mouth daily before breakfast.    Historical Provider, MD  omeprazole (PRILOSEC) 40 MG capsule Take 40 mg by mouth daily.    Historical Provider, MD  valsartan-hydrochlorothiazide (DIOVAN-HCT) 160-25 MG per tablet Take 1 tablet by mouth daily.    Historical Provider, MD  warfarin (COUMADIN) 2.5 MG tablet  Take 2.5 mg by mouth daily.    Historical Provider, MD   BP 175/77  Pulse 89  Temp(Src) 97.9 F (36.6 C) (Oral)  Resp 21  Ht 5\' 7"  (1.702 m)  Wt 184 lb (83.462 kg)  BMI 28.81 kg/m2  SpO2 98%  Physical Exam  Nursing note and vitals reviewed. Constitutional: He is oriented to person, place, and time. He appears well-developed and well-nourished. No distress.  HENT:  Head: Normocephalic and atraumatic.  Mouth/Throat: Oropharynx is clear and moist. No oropharyngeal exudate.  Eyes: Conjunctivae and EOM are normal. Pupils are equal, round, and reactive to light.  Neck: Normal range of motion. Neck supple.  No meningismus.  Cardiovascular: Normal rate, normal heart sounds and intact distal pulses.  An irregular rhythm present.  No  murmur heard. Pulmonary/Chest: Effort normal and breath sounds normal. No respiratory distress.  Abdominal: Soft. There is no tenderness. There is no rebound and no guarding.  Musculoskeletal: Normal range of motion. He exhibits no edema and no tenderness.  Neurological: He is alert and oriented to person, place, and time. No cranial nerve deficit. He exhibits normal muscle tone. Coordination normal.  No ataxia on finger to nose bilaterally. No pronator drift. 5/5 strength throughout. CN 2-12 intact. Negative Romberg. Equal grip strength. Sensation intact. Gait is normal.   Skin: Skin is warm.  Psychiatric: He has a normal mood and affect. His behavior is normal.    ED Course  Procedures (including critical care time)  DIAGNOSTIC STUDIES: Oxygen Saturation is 98% on room air, normal by my interpretation.    COORDINATION OF CARE: 9:15 PM-Discussed treatment plan which includes CXR, head CT, chest CT, and labs with pt at bedside and pt agreed to plan.     Labs Review Labs Reviewed  BASIC METABOLIC PANEL - Abnormal; Notable for the following:    Glucose, Bld 102 (*)    GFR calc non Af Amer 55 (*)    GFR calc Af Amer 64 (*)    All other components within normal limits  PROTIME-INR - Abnormal; Notable for the following:    Prothrombin Time 15.6 (*)    All other components within normal limits  CBC WITH DIFFERENTIAL  TROPONIN I    Imaging Review Dg Chest 2 View  07/04/2013   CLINICAL DATA:  Chest pain, headache, sinus pressure.  EXAM: CHEST  2 VIEW  COMPARISON:  08/30/2008  FINDINGS: Borderline heart size with normal pulmonary vascularity. Mild hyperinflation suggesting emphysema. Linear fibrosis in the lung bases. Calcified and tortuous aorta. No focal airspace disease in the lungs. No blunting of costophrenic angles. No pneumothorax. Degenerative changes in the spine. Postoperative changes in the mediastinum. No significant change since prior study.  IMPRESSION: Emphysematous changes  in the lungs with linear fibrosis in the lung bases. No active infiltration suggested.   Electronically Signed   By: Burman Nieves M.D.   On: 07/04/2013 21:52   Ct Head Wo Contrast  07/04/2013   CLINICAL DATA:  80 year old male with headache.  EXAM: CT HEAD WITHOUT CONTRAST  TECHNIQUE: Contiguous axial images were obtained from the base of the skull through the vertex without intravenous contrast.  COMPARISON:  06/07/2012  FINDINGS: Atrophy and mild chronic small-vessel white matter ischemic changes again noted.  No acute intracranial abnormalities are identified, including mass lesion or mass effect, hydrocephalus, extra-axial fluid collection, midline shift, hemorrhage, or acute infarction.  The visualized bony calvarium is unremarkable.  IMPRESSION: No evidence of acute intracranial abnormality.  Atrophy and mild  chronic small-vessel white matter ischemic changes.   Electronically Signed   By: Laveda Abbe M.D.   On: 07/04/2013 21:57   Ct Angio Chest Aorta W/cm &/or Wo/cm  07/04/2013   CLINICAL DATA:  Chest pain, back pain, headache, sinus pressure. Pain began after walking today. Felt like he was going to pass out.  EXAM: CT ANGIOGRAPHY CHEST WITH CONTRAST  TECHNIQUE: Multidetector CT imaging of the chest was performed using the standard protocol during bolus administration of intravenous contrast. Multiplanar CT image reconstructions and MIPs were obtained to evaluate the vascular anatomy.  CONTRAST:  OMNIPAQUE IOHEXOL 350 MG/ML SOLN  COMPARISON:  None.  FINDINGS: Technically adequate study with good opacification of the central and segmental pulmonary arteries. No focal filling defects demonstrated. No evidence of significant pulmonary embolus.  Postoperative changes in the mediastinum. Mild cardiac enlargement. Coronary artery and aortic calcification post bypass grafting. Normal caliber thoracic aorta. Esophagus is decompressed. No significant lymphadenopathy in the chest. There are scattered  calcified granulomas in the lungs with calcified right hilar and mediastinal lymph nodes present. No pleural effusions. Visualized portions of the upper abdominal organs suggest cholelithiasis.  Focal infiltration or atelectasis in the left lung base posteriorly. No pneumothorax. Degenerative changes in the thoracic spine. No destructive bone lesions are appreciated.  Review of the MIP images confirms the above findings.  IMPRESSION: No evidence of significant pulmonary embolus. Small focal area of infiltration or atelectasis in the left lung base posteriorly. Probable cholelithiasis.   Electronically Signed   By: Burman Nieves M.D.   On: 07/04/2013 22:48     EKG Interpretation   Date/Time:  Sunday July 04 2013 20:34:55 EDT Ventricular Rate:  73 PR Interval:    QRS Duration: 104 QT Interval:  386 QTC Calculation: 425 R Axis:   -24 Text Interpretation:  Atrial fibrillation with premature ventricular or  aberrantly conducted complexes Abnormal ECG No significant change was  found Confirmed by Manus Gunning  MD, STEPHEN [phone]) on 07/04/2013 8:41:17 PM      MDM   Final diagnoses:  Chest pain, unspecified chest pain type  Near syncope   patient with episode of near syncope, chest pain, nausea and shortness of breath that onset while he was speaking on the phone. Resolved after about 45 minutes. He is a poor historian. He has been visiting his wife who had a stroke and thinks that he overdid it. No chest pain on arrival. He complains of "sinus pressure" but denies headache. Nonfocal neuro exam. States compliance with his blood pressure medication but hypertensive on arrival.  Atrial fibrillation on EKG without acute change. CT head is negative. Chest x-ray is stable. INR subtherapeutic. Troponin negative. CXR clear.  Patient with CABG history and concern for ACS.  Chest pain free now.  EKG unchanged.   Hypertensive on arrival. CTA chest was obtained to evaluate for dissection.  Contrast was  timed more for PE study and Dr. Andria Meuse unable to comment on aorta.  However, dissection seems less likely as patient currently has no chest or back pain. Patient is unwilling to have CT again and IV contrast again.  Admission recommended for chest pain and ACS and aortic dissection rule out given risk factors.  Patient adamant that he does not want to be admitted. He understands he could be at risk for heart attack or death. He is alert and oriented x3 and appears to have capacity to make his own medical decisions. He is able to repeat the risks of leaving  against medical advice back to me including heart attack and death.  He understands and accepts these risks and will leave AMA.   BP 181/86  Pulse 82  Temp(Src) 97.9 F (36.6 C) (Oral)  Resp 18  Ht 5\' 7"  (1.702 m)  Wt 184 lb (83.462 kg)  BMI 28.81 kg/m2  SpO2 98%      I personally performed the services described in this documentation, which was scribed in my presence. The recorded information has been reviewed and is accurate.    Edward OctaveStephen Rancour, MD 07/05/13 [phone]  Edward OctaveStephen Rancour, MD 07/05/13 [phone]

## 2013-07-04 NOTE — ED Notes (Signed)
MD at bedside. 

## 2013-07-04 NOTE — ED Notes (Signed)
MD at bedside to discuss results of testing.  Pt again assisted with urinal.

## 2013-07-04 NOTE — ED Notes (Signed)
Patient transported to Ct via stretcher per tech.  Back with additional piv for appropriate pressure for CTA.

## 2013-07-04 NOTE — ED Notes (Addendum)
Pt to be transported back to [facility] via [facility].

## 2013-07-04 NOTE — ED Notes (Signed)
Pt is from [facility]. Sts that he had left sided chest pain this afternoon at 1600. Also sts he is having some sinus congestion.

## 2013-07-05 NOTE — ED Provider Notes (Signed)
Addendum: Sp[facility] with [facility].  She is aware of patient's decision to leave AMA.  She has arranged follow up with patient's cardiologist Dr. Judithe ModestFolk today.   Glynn OctaveStephen Rancour, MD 07/05/13 [id number]

## 2014-06-04 ENCOUNTER — Emergency Department (HOSPITAL_COMMUNITY): Payer: Medicare HMO

## 2014-06-04 ENCOUNTER — Emergency Department (HOSPITAL_COMMUNITY)
Admission: EM | Admit: 2014-06-04 | Discharge: 2014-06-05 | Disposition: A | Discharge status: Home / Self Care | Payer: Medicare HMO | Attending: Emergency Medicine | Admitting: Emergency Medicine

## 2014-06-04 ENCOUNTER — Encounter (HOSPITAL_COMMUNITY): Payer: Self-pay

## 2014-06-04 DIAGNOSIS — R059 Cough, unspecified: Secondary | ICD-10-CM

## 2014-06-04 DIAGNOSIS — R51 Headache: Secondary | ICD-10-CM | POA: Diagnosis not present

## 2014-06-04 DIAGNOSIS — Z79899 Other long term (current) drug therapy: Secondary | ICD-10-CM | POA: Insufficient documentation

## 2014-06-04 DIAGNOSIS — Z86718 Personal history of other venous thrombosis and embolism: Secondary | ICD-10-CM | POA: Diagnosis not present

## 2014-06-04 DIAGNOSIS — I4891 Unspecified atrial fibrillation: Secondary | ICD-10-CM | POA: Insufficient documentation

## 2014-06-04 DIAGNOSIS — E039 Hypothyroidism, unspecified: Secondary | ICD-10-CM | POA: Diagnosis not present

## 2014-06-04 DIAGNOSIS — I10 Essential (primary) hypertension: Secondary | ICD-10-CM | POA: Diagnosis not present

## 2014-06-04 DIAGNOSIS — Z87891 Personal history of nicotine dependence: Secondary | ICD-10-CM | POA: Insufficient documentation

## 2014-06-04 DIAGNOSIS — Z7901 Long term (current) use of anticoagulants: Secondary | ICD-10-CM | POA: Insufficient documentation

## 2014-06-04 DIAGNOSIS — R0981 Nasal congestion: Secondary | ICD-10-CM | POA: Insufficient documentation

## 2014-06-04 DIAGNOSIS — R05 Cough: Secondary | ICD-10-CM

## 2014-06-04 DIAGNOSIS — Z8669 Personal history of other diseases of the nervous system and sense organs: Secondary | ICD-10-CM | POA: Insufficient documentation

## 2014-06-04 DIAGNOSIS — Z951 Presence of aortocoronary bypass graft: Secondary | ICD-10-CM | POA: Insufficient documentation

## 2014-06-04 DIAGNOSIS — R519 Headache, unspecified: Secondary | ICD-10-CM

## 2014-06-04 LAB — CBC WITH DIFFERENTIAL/PLATELET
Basophils Absolute: 0 10*3/uL (ref 0.0–0.1)
Basophils Relative: 0 % (ref 0–1)
Eosinophils Absolute: 0 10*3/uL (ref 0.0–0.7)
Eosinophils Relative: 0 % (ref 0–5)
HCT: 39.8 % (ref 39.0–52.0)
HEMOGLOBIN: 13.2 g/dL (ref 13.0–17.0)
Lymphocytes Relative: 6 % — ABNORMAL LOW (ref 12–46)
Lymphs Abs: 0.6 10*3/uL — ABNORMAL LOW (ref 0.7–4.0)
MCH: 30.1 pg (ref 26.0–34.0)
MCHC: 33.2 g/dL (ref 30.0–36.0)
MCV: 90.7 fL (ref 78.0–100.0)
MONOS PCT: 9 % (ref 3–12)
Monocytes Absolute: 0.9 10*3/uL (ref 0.1–1.0)
NEUTROS PCT: 85 % — AB (ref 43–77)
Neutro Abs: 8.4 10*3/uL — ABNORMAL HIGH (ref 1.7–7.7)
PLATELETS: 188 10*3/uL (ref 150–400)
RBC: 4.39 MIL/uL (ref 4.22–5.81)
RDW: 14.5 % (ref 11.5–15.5)
WBC: 9.9 10*3/uL (ref 4.0–10.5)

## 2014-06-04 LAB — PROTIME-INR
INR: 2.22 — ABNORMAL HIGH (ref 0.00–1.49)
Prothrombin Time: 24.4 seconds — ABNORMAL HIGH (ref 11.6–15.2)

## 2014-06-04 LAB — BASIC METABOLIC PANEL
Anion gap: 11 (ref 5–15)
BUN: 18 mg/dL (ref 6–20)
CALCIUM: 8 mg/dL — AB (ref 8.9–10.3)
CO2: 24 mmol/L (ref 22–32)
Chloride: 99 mmol/L — ABNORMAL LOW (ref 101–111)
Creatinine, Ser: 1.23 mg/dL (ref 0.61–1.24)
GFR calc Af Amer: 56 mL/min — ABNORMAL LOW (ref >60)
GFR calc non Af Amer: 48 mL/min — ABNORMAL LOW (ref >60)
GLUCOSE: 111 mg/dL — AB (ref 65–99)
Potassium: 3.9 mmol/L (ref 3.5–5.1)
Sodium: 134 mmol/L — ABNORMAL LOW (ref 135–145)

## 2014-06-04 NOTE — ED Provider Notes (Signed)
CSN: [id number]     Arrival date & time 06/04/14  2126 History   First MD Initiated Contact with Patient 06/04/14 2137     Chief Complaint  Patient presents with  . Headache  . Cough     (Consider location/radiation/quality/duration/timing/severity/associated sxs/prior Treatment) HPI  81 year old male sent from assisted living facility due to cough, congestion, and headache. He states that 3 days ago he developed cough as well as congestion and thinks he is coughing up clear sputum. Feels congestion in his nose as well. Denies chest pain or shortness of breath. Has not had any fevers. Has had a frontal headache intermittently during this time as well. Denies a sudden onset of headache. Headache is improved with Tylenol but still remains present. Rates his pain as a 7/10 at this time, declines pain meds. Chronically blind from macular degeneration but denies any other symptoms such as weakness, numbness, or neck stiffness.  Past Medical History  Diagnosis Date  . Atrial fibrillation   . S/P CABG x 1   . DVT (deep venous thrombosis)   . Hypertension   . Hypothyroid   . Macular degeneration    Past Surgical History  Procedure Laterality Date  . Coronary artery bypass graft    . Appendectomy     History reviewed. No pertinent family history. History  Substance Use Topics  . Smoking status: Former Games developer  . Smokeless tobacco: Not on file  . Alcohol Use: Yes     Comment: occasional     Review of Systems  Constitutional: Negative for fever.  HENT: Positive for congestion.   Respiratory: Positive for cough. Negative for shortness of breath.   Cardiovascular: Negative for chest pain.  Gastrointestinal: Negative for nausea and vomiting.  Neurological: Positive for headaches. Negative for weakness and numbness.  All other systems reviewed and are negative.     Allergies  Review of patient's allergies indicates no known allergies.  Home Medications   Prior to Admission  medications   Medication Sig Start Date End Date Taking? Authorizing Provider  amLODipine (NORVASC) 5 MG tablet Take 5 mg by mouth daily.    Historical Provider, MD  atorvastatin (LIPITOR) 20 MG tablet Take 20 mg by mouth daily.    Historical Provider, MD  furosemide (LASIX) 20 MG tablet Take 20 mg by mouth 2 (two) times daily.    Historical Provider, MD  levothyroxine (SYNTHROID, LEVOTHROID) 25 MCG tablet Take 25 mcg by mouth daily before breakfast.    Historical Provider, MD  omeprazole (PRILOSEC) 40 MG capsule Take 40 mg by mouth daily.    Historical Provider, MD  valsartan-hydrochlorothiazide (DIOVAN-HCT) 160-25 MG per tablet Take 1 tablet by mouth daily.    Historical Provider, MD  warfarin (COUMADIN) 2.5 MG tablet Take 2.5 mg by mouth daily.    Historical Provider, MD   BP 141/57 mmHg  Pulse 90  Temp(Src) 99.7 F (37.6 C) (Oral)  Resp 18  SpO2 96% Physical Exam  Constitutional: He is oriented to person, place, and time. He appears well-developed and well-nourished. No distress.  HENT:  Head: Normocephalic and atraumatic.  Right Ear: External ear normal.  Left Ear: External ear normal.  Nose: Nose normal.  Eyes: EOM are normal. Pupils are equal, round, and reactive to light. Right eye exhibits no discharge. Left eye exhibits no discharge.  Neck: Neck supple.  Cardiovascular: Normal rate, regular rhythm, normal heart sounds and intact distal pulses.   Pulmonary/Chest: Effort normal and breath sounds normal. He has no wheezes.  Abdominal: Soft. There is no tenderness.  Musculoskeletal: He exhibits no edema.  Neurological: He is alert and oriented to person, place, and time.  CN 2-12 grossly intact. 5/5 strength in all 4 extremities. Grossly normal sensation  Skin: Skin is warm and dry. He is not diaphoretic.  Nursing note and vitals reviewed.   ED Course  Procedures (including critical care time) Labs Review Labs Reviewed  BASIC METABOLIC PANEL - Abnormal; Notable for the  following:    Sodium 134 (*)    Chloride 99 (*)    Glucose, Bld 111 (*)    Calcium 8.0 (*)    GFR calc non Af Amer 48 (*)    GFR calc Af Amer 56 (*)    All other components within normal limits  CBC WITH DIFFERENTIAL/PLATELET - Abnormal; Notable for the following:    Neutrophils Relative % 85 (*)    Neutro Abs 8.4 (*)    Lymphocytes Relative 6 (*)    Lymphs Abs 0.6 (*)    All other components within normal limits  PROTIME-INR - Abnormal; Notable for the following:    Prothrombin Time 24.4 (*)    INR 2.22 (*)    All other components within normal limits    Imaging Review Dg Chest 2 View  06/04/2014   CLINICAL DATA:  Cough for 2 days.  EXAM: CHEST  2 VIEW  COMPARISON:  Radiographs and CT 07/04/2013  FINDINGS: Patient is post median sternotomy. Cardiomediastinal contours are unchanged with tortuosity of the thoracic aorta. Atelectasis or scarring in the lingula, unchanged from prior exam. No new airspace consolidation. Calcified granuloma over the right lung base. No pleural effusion, pneumothorax, or pulmonary edema. Borderline hyperinflation, unchanged. No acute osseous abnormality.  IMPRESSION: Scarring or atelectasis in the lingula.  No acute pulmonary process.   Electronically Signed   By: Rubye Oaks M.D.   On: 06/04/2014 23:40   Ct Head Wo Contrast  06/04/2014   CLINICAL DATA:  81 year old male with frontal and posterior headache for 2 days.  EXAM: CT HEAD WITHOUT CONTRAST  TECHNIQUE: Contiguous axial images were obtained from the base of the skull through the vertex without intravenous contrast.  COMPARISON:  07/04/2013  FINDINGS: Age related atrophy AH and chronic small vessel ischemic change, stable from prior exam.No intracranial hemorrhage, mass effect, or midline shift. No hydrocephalus. The basilar cisterns are patent. No evidence of territorial infarct. No intracranial fluid collection. Calvarium is intact. Included paranasal sinuses and mastoid air cells are well aerated.   IMPRESSION: 1.  No acute intracranial abnormality. 2. Age-related atrophy and mild chronic small vessel ischemic change.   Electronically Signed   By: Rubye Oaks M.D.   On: 06/04/2014 23:57     EKG Interpretation None      MDM   Final diagnoses:  Cough  Headache, unspecified headache type    No evidence of pneumonia on chest x-ray. No hypoxia, increased work of breathing, or abnormal lung sounds on my exam. Patient is otherwise well appearing. Patient's headache is most likely from his cough but given his age his CT was obtained and shows no subdural or epidural hematoma. No obvious subarachnoid and I have low suspicion that that would be causing his gradually worsening headache that is been intermittent over several days. Normal neurologic exam. At this point he is stable for discharge back to his facility, states he is not on anything for cough, will prescribe antitussives at this time. Discussed strict return precautions.    Aryka Coonradt  Criss AlvineGoldston, MD 06/05/14 [id number]

## 2014-06-04 NOTE — ED Notes (Signed)
Per EMS, Cough and Headache x 2 days. [place] is [place] [facility] [place]. EMS reports of some rhonchi in right lower lobe but pt denies sob/dyspnea. Pt was seen by a nurse and pt was given an allergy medication and cough suppressant without relief. Pt in no acute distress. Pt is most concerned most with headache since it has not went away. Pt hasn't had much to drink since this started. EMS VS 150/80, 80hr, RR 16, 96%

## 2014-06-05 MED ORDER — BENZONATATE 100 MG PO CAPS: 100.0000 mg | ORAL_CAPSULE | Freq: Two times a day (BID) | ORAL | Status: DC | PRN

## 2014-06-05 MED ORDER — ACETAMINOPHEN 500 MG PO TABS
1000.0000 mg | ORAL_TABLET | Freq: Once | ORAL | Status: AC
  Administered 2014-06-05: 1000 mg via ORAL
  Filled 2014-06-05: qty 2

## 2014-06-05 NOTE — ED Notes (Signed)
Pt verbalized understanding of d/c instructions and has no further questions. Pt being discharged home with [facility] transportation

## 2014-11-10 ENCOUNTER — Inpatient Hospital Stay (HOSPITAL_BASED_OUTPATIENT_CLINIC_OR_DEPARTMENT_OTHER)
Admission: EM | Admit: 2014-11-10 | Discharge: 2014-11-14 | DRG: 389 | Disposition: A | Discharge status: Nursing Home (Includes ICF) | Payer: Medicare HMO | Attending: Internal Medicine | Admitting: Internal Medicine

## 2014-11-10 ENCOUNTER — Emergency Department (HOSPITAL_BASED_OUTPATIENT_CLINIC_OR_DEPARTMENT_OTHER): Payer: Medicare HMO

## 2014-11-10 ENCOUNTER — Encounter (HOSPITAL_BASED_OUTPATIENT_CLINIC_OR_DEPARTMENT_OTHER): Payer: Self-pay | Admitting: Emergency Medicine

## 2014-11-10 DIAGNOSIS — Z87891 Personal history of nicotine dependence: Secondary | ICD-10-CM

## 2014-11-10 DIAGNOSIS — K565 Intestinal adhesions [bands] with obstruction (postprocedural) (postinfection): Principal | ICD-10-CM | POA: Diagnosis present

## 2014-11-10 DIAGNOSIS — N179 Acute kidney failure, unspecified: Secondary | ICD-10-CM

## 2014-11-10 DIAGNOSIS — I482 Chronic atrial fibrillation: Secondary | ICD-10-CM | POA: Diagnosis not present

## 2014-11-10 DIAGNOSIS — I10 Essential (primary) hypertension: Secondary | ICD-10-CM

## 2014-11-10 DIAGNOSIS — E785 Hyperlipidemia, unspecified: Secondary | ICD-10-CM | POA: Diagnosis present

## 2014-11-10 DIAGNOSIS — K219 Gastro-esophageal reflux disease without esophagitis: Secondary | ICD-10-CM | POA: Diagnosis present

## 2014-11-10 DIAGNOSIS — K56609 Unspecified intestinal obstruction, unspecified as to partial versus complete obstruction: Secondary | ICD-10-CM | POA: Diagnosis present

## 2014-11-10 DIAGNOSIS — E039 Hypothyroidism, unspecified: Secondary | ICD-10-CM | POA: Diagnosis present

## 2014-11-10 DIAGNOSIS — N289 Disorder of kidney and ureter, unspecified: Secondary | ICD-10-CM

## 2014-11-10 DIAGNOSIS — F0390 Unspecified dementia without behavioral disturbance: Secondary | ICD-10-CM | POA: Diagnosis present

## 2014-11-10 DIAGNOSIS — R109 Unspecified abdominal pain: Secondary | ICD-10-CM

## 2014-11-10 DIAGNOSIS — K5669 Other intestinal obstruction: Secondary | ICD-10-CM | POA: Diagnosis not present

## 2014-11-10 DIAGNOSIS — I4891 Unspecified atrial fibrillation: Secondary | ICD-10-CM

## 2014-11-10 DIAGNOSIS — I480 Paroxysmal atrial fibrillation: Secondary | ICD-10-CM | POA: Diagnosis not present

## 2014-11-10 HISTORY — DX: Unspecified intestinal obstruction, unspecified as to partial versus complete obstruction: K56.609

## 2014-11-10 LAB — CBC WITH DIFFERENTIAL/PLATELET
Basophils Absolute: 0 10*3/uL (ref 0.0–0.1)
Basophils Relative: 0 %
Eosinophils Absolute: 0 10*3/uL (ref 0.0–0.7)
Eosinophils Relative: 0 %
HCT: 43.7 % (ref 39.0–52.0)
HEMOGLOBIN: 14.9 g/dL (ref 13.0–17.0)
LYMPHS PCT: 4 %
Lymphs Abs: 0.6 10*3/uL — ABNORMAL LOW (ref 0.7–4.0)
MCH: 31.2 pg (ref 26.0–34.0)
MCHC: 34.1 g/dL (ref 30.0–36.0)
MCV: 91.6 fL (ref 78.0–100.0)
MONO ABS: 1.2 10*3/uL — AB (ref 0.1–1.0)
MONOS PCT: 8 %
NEUTROS ABS: 13.4 10*3/uL — AB (ref 1.7–7.7)
Neutrophils Relative %: 88 %
Platelets: 212 10*3/uL (ref 150–400)
RBC: 4.77 MIL/uL (ref 4.22–5.81)
RDW: 14.3 % (ref 11.5–15.5)
WBC: 15.3 10*3/uL — ABNORMAL HIGH (ref 4.0–10.5)

## 2014-11-10 LAB — URINE MICROSCOPIC-ADD ON

## 2014-11-10 LAB — LIPASE, BLOOD: Lipase: 26 U/L (ref 11–51)

## 2014-11-10 LAB — CREATININE, SERUM
CREATININE: 1.86 mg/dL — AB (ref 0.61–1.24)
GFR calc Af Amer: 34 mL/min — ABNORMAL LOW (ref >60)
GFR calc non Af Amer: 29 mL/min — ABNORMAL LOW (ref >60)

## 2014-11-10 LAB — CBC
HEMATOCRIT: 41.8 % (ref 39.0–52.0)
HEMOGLOBIN: 14.1 g/dL (ref 13.0–17.0)
MCH: 31.1 pg (ref 26.0–34.0)
MCHC: 33.7 g/dL (ref 30.0–36.0)
MCV: 92.3 fL (ref 78.0–100.0)
Platelets: 199 10*3/uL (ref 150–400)
RBC: 4.53 MIL/uL (ref 4.22–5.81)
RDW: 14.3 % (ref 11.5–15.5)
WBC: 11.7 10*3/uL — ABNORMAL HIGH (ref 4.0–10.5)

## 2014-11-10 LAB — I-STAT CG4 LACTIC ACID, ED: Lactic Acid, Venous: 1.81 mmol/L (ref 0.5–2.0)

## 2014-11-10 LAB — COMPREHENSIVE METABOLIC PANEL
ALBUMIN: 3.7 g/dL (ref 3.5–5.0)
ALT: 18 U/L (ref 17–63)
ANION GAP: 12 (ref 5–15)
AST: 28 U/L (ref 15–41)
Alkaline Phosphatase: 64 U/L (ref 38–126)
BILIRUBIN TOTAL: 2.3 mg/dL — AB (ref 0.3–1.2)
BUN: 41 mg/dL — AB (ref 6–20)
CALCIUM: 9 mg/dL (ref 8.9–10.3)
CO2: 25 mmol/L (ref 22–32)
CREATININE: 2.02 mg/dL — AB (ref 0.61–1.24)
Chloride: 103 mmol/L (ref 101–111)
GFR calc Af Amer: 31 mL/min — ABNORMAL LOW (ref >60)
GFR calc non Af Amer: 26 mL/min — ABNORMAL LOW (ref >60)
GLUCOSE: 151 mg/dL — AB (ref 65–99)
Potassium: 4.1 mmol/L (ref 3.5–5.1)
SODIUM: 140 mmol/L (ref 135–145)
TOTAL PROTEIN: 6.7 g/dL (ref 6.5–8.1)

## 2014-11-10 LAB — URINALYSIS, ROUTINE W REFLEX MICROSCOPIC
BILIRUBIN URINE: NEGATIVE
GLUCOSE, UA: NEGATIVE mg/dL
Ketones, ur: NEGATIVE mg/dL
Leukocytes, UA: NEGATIVE
NITRITE: NEGATIVE
PH: 5.5 (ref 5.0–8.0)
Protein, ur: NEGATIVE mg/dL
SPECIFIC GRAVITY, URINE: 1.019 (ref 1.005–1.030)
Urobilinogen, UA: 1 mg/dL (ref 0.0–1.0)

## 2014-11-10 LAB — PROTIME-INR
INR: 3.97 — ABNORMAL HIGH (ref 0.00–1.49)
Prothrombin Time: 37.8 seconds — ABNORMAL HIGH (ref 11.6–15.2)

## 2014-11-10 LAB — TSH: TSH: 0.761 u[IU]/mL (ref 0.350–4.500)

## 2014-11-10 MED ORDER — FLUTICASONE PROPIONATE 50 MCG/ACT NA SUSP
1.0000 | Freq: Two times a day (BID) | NASAL | Status: DC
  Administered 2014-11-10 – 2014-11-14 (×5): 1 via NASAL
  Filled 2014-11-10: qty 16

## 2014-11-10 MED ORDER — AMLODIPINE BESYLATE 5 MG PO TABS
5.0000 mg | ORAL_TABLET | Freq: Every day | ORAL | Status: DC
  Administered 2014-11-13: 5 mg via ORAL
  Filled 2014-11-10 (×5): qty 1

## 2014-11-10 MED ORDER — LIDOCAINE VISCOUS 2 % MT SOLN: 15.0000 mL | Freq: Once | OROMUCOSAL | Status: DC

## 2014-11-10 MED ORDER — LEVOTHYROXINE SODIUM 100 MCG IV SOLR
25.0000 ug | INTRAVENOUS | Status: DC
  Administered 2014-11-11 – 2014-11-14 (×4): 25 ug via INTRAVENOUS
  Filled 2014-11-10 (×4): qty 5

## 2014-11-10 MED ORDER — ACETAMINOPHEN 325 MG PO TABS
650.0000 mg | ORAL_TABLET | Freq: Four times a day (QID) | ORAL | Status: DC | PRN
  Administered 2014-11-12 – 2014-11-13 (×3): 650 mg via ORAL
  Filled 2014-11-10 (×3): qty 2

## 2014-11-10 MED ORDER — FAMOTIDINE 20 MG PO TABS
20.0000 mg | ORAL_TABLET | Freq: Every day | ORAL | Status: DC
  Filled 2014-11-10: qty 1

## 2014-11-10 MED ORDER — ONDANSETRON HCL 4 MG PO TABS: 4.0000 mg | ORAL_TABLET | Freq: Four times a day (QID) | ORAL | Status: DC | PRN

## 2014-11-10 MED ORDER — DEXTROSE-NACL 5-0.9 % IV SOLN
INTRAVENOUS | Status: DC
  Administered 2014-11-10 – 2014-11-14 (×4): via INTRAVENOUS

## 2014-11-10 MED ORDER — ATORVASTATIN CALCIUM 20 MG PO TABS
20.0000 mg | ORAL_TABLET | Freq: Every day | ORAL | Status: DC
  Administered 2014-11-13 – 2014-11-14 (×2): 20 mg via ORAL
  Filled 2014-11-10 (×5): qty 1

## 2014-11-10 MED ORDER — ONDANSETRON HCL 4 MG/2ML IJ SOLN
4.0000 mg | Freq: Four times a day (QID) | INTRAMUSCULAR | Status: DC | PRN
  Administered 2014-11-14: 4 mg via INTRAVENOUS
  Filled 2014-11-10: qty 2

## 2014-11-10 MED ORDER — DONEPEZIL HCL 5 MG PO TABS
5.0000 mg | ORAL_TABLET | Freq: Every day | ORAL | Status: DC
  Administered 2014-11-12 – 2014-11-13 (×2): 5 mg via ORAL
  Filled 2014-11-10 (×4): qty 1

## 2014-11-10 MED ORDER — HEPARIN SODIUM (PORCINE) 5000 UNIT/ML IJ SOLN: 5000.0000 [IU] | Freq: Three times a day (TID) | INTRAMUSCULAR | Status: DC

## 2014-11-10 MED ORDER — PANTOPRAZOLE SODIUM 40 MG PO TBEC
80.0000 mg | DELAYED_RELEASE_TABLET | Freq: Every day | ORAL | Status: DC
  Filled 2014-11-10: qty 2

## 2014-11-10 MED ORDER — VITAMIN B-1 100 MG PO TABS
100.0000 mg | ORAL_TABLET | Freq: Every day | ORAL | Status: DC
  Administered 2014-11-13 – 2014-11-14 (×2): 100 mg via ORAL
  Filled 2014-11-10 (×5): qty 1

## 2014-11-10 MED ORDER — SODIUM CHLORIDE 0.9 % IV SOLN
80.0000 mg | INTRAVENOUS | Status: DC
  Administered 2014-11-11 – 2014-11-14 (×4): 80 mg via INTRAVENOUS
  Filled 2014-11-10 (×5): qty 80

## 2014-11-10 MED ORDER — INSULIN ASPART 100 UNIT/ML ~~LOC~~ SOLN
0.0000 [IU] | Freq: Three times a day (TID) | SUBCUTANEOUS | Status: DC
  Administered 2014-11-11 – 2014-11-12 (×3): 2 [IU] via SUBCUTANEOUS

## 2014-11-10 MED ORDER — MORPHINE SULFATE (PF) 2 MG/ML IV SOLN
2.0000 mg | INTRAVENOUS | Status: DC | PRN
Start: 2014-11-10 — End: 2014-11-14
  Administered 2014-11-14: 2 mg via INTRAVENOUS
  Filled 2014-11-10: qty 1

## 2014-11-10 MED ORDER — FOLIC ACID 1 MG PO TABS
1.0000 mg | ORAL_TABLET | Freq: Every day | ORAL | Status: DC
  Administered 2014-11-13 – 2014-11-14 (×2): 1 mg via ORAL
  Filled 2014-11-10 (×5): qty 1

## 2014-11-10 MED ORDER — FAMOTIDINE IN NACL 20-0.9 MG/50ML-% IV SOLN
20.0000 mg | Freq: Every day | INTRAVENOUS | Status: DC
  Administered 2014-11-10 – 2014-11-13 (×4): 20 mg via INTRAVENOUS
  Filled 2014-11-10 (×5): qty 50

## 2014-11-10 MED ORDER — SODIUM CHLORIDE 0.9 % IV BOLUS (SEPSIS)
500.0000 mL | Freq: Once | INTRAVENOUS | Status: AC
  Administered 2014-11-10: 500 mL via INTRAVENOUS

## 2014-11-10 MED ORDER — ADULT MULTIVITAMIN W/MINERALS CH
1.0000 | ORAL_TABLET | Freq: Every day | ORAL | Status: DC
  Administered 2014-11-13 – 2014-11-14 (×2): 1 via ORAL
  Filled 2014-11-10 (×5): qty 1

## 2014-11-10 MED ORDER — MORPHINE SULFATE (PF) 2 MG/ML IV SOLN
2.0000 mg | Freq: Once | INTRAVENOUS | Status: AC
  Administered 2014-11-10: 2 mg via INTRAVENOUS
  Filled 2014-11-10: qty 1

## 2014-11-10 MED ORDER — SODIUM CHLORIDE 0.9 % IJ SOLN
3.0000 mL | Freq: Two times a day (BID) | INTRAMUSCULAR | Status: DC
  Administered 2014-11-10 – 2014-11-13 (×3): 3 mL via INTRAVENOUS

## 2014-11-10 MED ORDER — ACETAMINOPHEN 650 MG RE SUPP: 650.0000 mg | Freq: Four times a day (QID) | RECTAL | Status: DC | PRN

## 2014-11-10 MED ORDER — LEVOTHYROXINE SODIUM 25 MCG PO TABS: 25.0000 ug | ORAL_TABLET | Freq: Every day | ORAL | Status: DC

## 2014-11-10 MED ORDER — ONDANSETRON HCL 4 MG/2ML IJ SOLN
4.0000 mg | Freq: Once | INTRAMUSCULAR | Status: AC
  Administered 2014-11-10: 4 mg via INTRAVENOUS
  Filled 2014-11-10: qty 2

## 2014-11-10 MED ORDER — SODIUM CHLORIDE 0.9 % IV SOLN
INTRAVENOUS | Status: AC
Start: 2014-11-10 — End: 2014-11-11

## 2014-11-10 NOTE — H&P (Signed)
[facility] History and Physical  Edward Fitzpatrick RUE:[id number] DOB: 01/25/1916 DOA: 11/10/2014  Referring physician: Blake Divine, MD PCP: Olivia Mackie, MD   Chief Complaint: Abdominal Pain  HPI: Edward Fitzpatrick is a 81 y.o. male with history of A Fib HTN Hypothyroid HLD dementia CAD DVT presents to the hospital with complaints of abdominal pain. Patient states that he started to have pain in his abdomen which he described as more a soreness that pain. He states that this has been going for 2 weeks or so. Patient states that he had no vomiting noted. He had no nausea but states that he did have 2 episodes of dry heaves related to taking some type of pills. Patient had no fevers noted. He has no chest pain noted. He has no diarrhea noted. He states that he had no fainting but has some weakness noted. He sates that he has not been able to eat for the last 2 days. Patient states that he has been taking care of his wife due to her having a stroke recently. Patient states he went to the ED and to be evaluated and there he had a CT scan done and a NG was placed   Review of Systems:  Constitutional:  +weight loss, Fevers, chills, +fatigue.  HEENT:  No headaches, No sneezing, itching, ear ache, nasal congestion, post nasal drip,  Cardio-vascular:  No chest pain, swelling in lower extremities, dizziness, palpitations  GI:  No heartburn, +indigestion, +abdominal pain, +nausea, +vomiting, no diarrhea  Resp:  No shortness of breath with exertion or at rest. no productive cough, No coughing up of blood Skin:  no rash or lesions.  GU:  no dysuria, change in color of urine, no urgency or frequency. No flank pain.  Musculoskeletal:  No joint pain or swelling. No decreased range of motion. No back pain.  Psych:  No change in mood or affect. No depression or anxiety. No memory loss.   Past Medical History  Diagnosis Date  . Atrial fibrillation (HCC)   . S/P CABG x 1   . DVT (deep venous  thrombosis) (HCC)   . Hypertension   . Hypothyroid   . Macular degeneration    Past Surgical History  Procedure Laterality Date  . Coronary artery bypass graft    . Appendectomy     Social History:  reports that he has quit smoking. He does not have any smokeless tobacco history on file. He reports that he drinks alcohol. He reports that he does not use illicit drugs.  No Known Allergies  No family history on file.   Prior to Admission medications   Medication Sig Start Date End Date Taking? Authorizing Provider  acetaminophen (TYLENOL) 500 MG tablet Take 1,000 mg by mouth every 6 (six) hours as needed for mild pain or headache.   Yes Historical Provider, MD  amLODipine (NORVASC) 5 MG tablet Take 5 mg by mouth daily.   Yes Historical Provider, MD  aspirin EC 81 MG tablet Take 81 mg by mouth daily.   Yes Historical Provider, MD  atorvastatin (LIPITOR) 20 MG tablet Take 20 mg by mouth daily.   Yes Historical Provider, MD  Cholecalciferol 1000 UNITS capsule Take 2,000 Units by mouth daily.  02/14/14  Yes Historical Provider, MD  cyanocobalamin 2000 MCG tablet Take 2,000 mcg by mouth daily.   Yes Historical Provider, MD  donepezil (ARICEPT) 5 MG tablet Take 5 mg by mouth daily at 6 PM. 05/02/14  Yes Historical Provider, MD  fluticasone (  FLONASE) 50 MCG/ACT nasal spray Place 1 spray into both nostrils 2 (two) times daily. 10/11/14  Yes Historical Provider, MD  levothyroxine (SYNTHROID, LEVOTHROID) 25 MCG tablet Take 25 mcg by mouth daily before breakfast.   Yes Historical Provider, MD  losartan-hydrochlorothiazide (HYZAAR) 100-12.5 MG tablet Take 1 tablet by mouth daily.   Yes Historical Provider, MD  multivitamin-lutein (OCUVITE-LUTEIN) CAPS capsule Take 1 capsule by mouth daily.   Yes Historical Provider, MD  omeprazole (PRILOSEC) 40 MG capsule Take 40 mg by mouth daily.   Yes Historical Provider, MD  OVER THE COUNTER MEDICATION Eye wash, uses as needed for dry eyes   Yes Historical Provider,  MD  ranitidine (ZANTAC) 150 MG tablet Take 150 mg by mouth daily. 10/08/14  Yes Historical Provider, MD  traMADol (ULTRAM) 50 MG tablet Take 50 mg by mouth daily as needed. 11/03/14 11/03/15 Yes Historical Provider, MD  warfarin (COUMADIN) 5 MG tablet Take 5 mg by mouth daily at 6 PM. 05/09/14 05/09/15 Yes Historical Provider, MD   Physical Exam: Filed Vitals:   11/10/14 1356 11/10/14 1400 11/10/14 1539 11/10/14 1600  BP: 131/76 137/69 121/81 159/55  Pulse: 85 90 37 49  Temp:      TempSrc:      Resp: Height:      Weight:      SpO2: 97% 98% 99% 91%    Wt Readings from Last 3 Encounters:  11/10/14 76.204 kg (168 lb)  07/04/13 83.462 kg (184 lb)  07/08/12 83.915 kg (185 lb)    General:  Appears calm and comfortable Eyes: PERRL, normal lids, irises & conjunctiva ENT: grossly normal hearing, lips & tongue Neck: no LAD, masses or thyromegaly Cardiovascular: IRR, no m/r/g. No LE edema Respiratory: CTA bilaterally, no w/r/r. Normal respiratory effort. Abdomen: soft, distended c/o tenderness no rebound noted Skin: no rash or induration seen on limited exam Musculoskeletal: grossly normal tone BUE/BLE Psychiatric: grossly normal mood and affect Neurologic: grossly non-focal.          Labs on Admission:  Basic Metabolic Panel:  Recent Labs Lab 11/10/14 1220  NA 140  K 4.1  CL 103  CO2 25  GLUCOSE 151*  BUN 41*  CREATININE 2.02*  CALCIUM 9.0   Liver Function Tests:  Recent Labs Lab 11/10/14 1220  AST 28  ALT 18  ALKPHOS 64  BILITOT 2.3*  PROT 6.7  ALBUMIN 3.7    Recent Labs Lab 11/10/14 1220  LIPASE 26   No results for input(s): AMMONIA in the last 168 hours. CBC:  Recent Labs Lab 11/10/14 1220  WBC 15.3*  NEUTROABS 13.4*  HGB 14.9  HCT 43.7  MCV 91.6  PLT 212   Cardiac Enzymes: No results for input(s): CKTOTAL, CKMB, CKMBINDEX, TROPONINI in the last 168 hours.  BNP (last 3 results) No results for input(s): BNP in the last 8760  hours.  ProBNP (last 3 results) No results for input(s): PROBNP in the last 8760 hours.  CBG: No results for input(s): GLUCAP in the last 168 hours.  Radiological Exams on Admission: Ct Abdomen Pelvis Wo Contrast  11/10/2014  CLINICAL DATA:  Upper abdominal pain x 3 days with N/V; oral contrast only - GFR 26 EXAM: CT ABDOMEN AND PELVIS WITHOUT CONTRAST TECHNIQUE: Multidetector CT imaging of the abdomen and pelvis was performed following the standard protocol without IV contrast. COMPARISON:  06/08/2012 FINDINGS: Lower chest: There are fibrotic changes at the lung bases bilaterally. Small calcified nodule is identified at the  right lung base consistent with granuloma. The heart is enlarged. There is extensive coronary artery calcification. No pericardial effusion. Upper abdomen: No focal abnormality identified within the liver, spleen, pancreas, or adrenal glands. Low-attenuation lesions are identified within the kidneys consistent with cysts. A small hyperdense lesion is identified along the anterior midpole of the right kidney, consistent with cyst based on stability and previous contrast-enhanced exams. Gallbladder is present. Layering high attenuation stones or sludge identified within the gallbladder. Gastrointestinal tract: The stomach has a normal appearance but is distended with contrast. There is dilatation of proximal and mid small bowel loops which measure up to 4.6 cm in the left mid abdomen. Point of obstruction appears to the left mid to lower abdomen. There is the appearance of a twist within the mesentery. Findings favor internal hernia or adhesion as a cause for the obstruction. Contrast does not reach the nondilated distal small bowel loops. There is a small amount of fluid within the small bowel mesenteric, surgically within the left mid abdomen. There are numerous colonic diverticula but no CT evidence for acute diverticulitis. The appendix is surgically absent. Pelvis: Urinary bladder,  prostate gland, and seminal vesicles have a normal appearance. Retroperitoneum: There is dense atherosclerotic calcification of the abdominal aorta. No aneurysm. No retroperitoneal or mesenteric adenopathy. Abdominal wall: Unremarkable. Osseous structures: Moderate to significant degenerative changes are identified within the entire lumbar spine. Vacuum disc phenomenon identified at numerous levels. No suspicious lytic or blastic lesions are identified. IMPRESSION: 1. Small bowel obstruction secondary to adhesions or internal hernia best localized to the mid small bowel in the left lower quadrant. No mass identified is source of obstruction. 2. Significant colonic diverticulosis. 3. Fibrotic changes at the lung bases. 4. Cardiomegaly and coronary artery disease. 5. Bilateral renal cysts. 6. Layering stones or sludge within the gallbladder. 7. Significant degenerative change within the spine. Electronically Signed   By: Norva PavlovElizabeth  Brown M.D.   On: 11/10/2014 14:29      Assessment/Plan Principal Problem:   Small bowel obstruction (HCC) Active Problems:   HTN (hypertension)   Atrial fibrillation (HCC)   Hypothyroidism   AKI (acute kidney injury) (HCC)   1. SBO secondary to adhesions -patient appears to be dehydrated from poor PO intake -will start on IVF -will keep NPO for now -NGT already placed -will get surgery to see patient (ED discussed case with Dr Carolynne Edouardoth) I spoke with Dr Ezzard StandingNewman will see in am -pain control MSO4 PRN  2. HTN -will hold hyzaar due to renal function being elevated -continue with norvasc -monitor pressures  3. Atrial Fibrillation -on chronic anticoagulation with warfarin -will hold warfarin for now due to SBO ?need for surgical intervention -repeat INR in AM  4. Hypothryoidism -continue with synthroid -check TSH  5. AKI -will hydrate -monitor labs -i am going to hold the hyzaar for now  6. Dementia -will continue with aricept  7. HLD -will continue with  lipitor  8. GERD -will continue with Pepcid and PPI     Code Status: full code (must indicate code status--if unknown or must be presumed, indicate so) DVT Prophylaxis:heparin Family Communication:none (indicate person spoken with, if applicable, with phone number if by telephone) Disposition Plan: home (indicate anticipated LOS)    [facility] Center[facility]AN,Atavia Poppe A [facility] Pager [phone]

## 2014-11-10 NOTE — ED Notes (Signed)
MD at bedside. 

## 2014-11-10 NOTE — Progress Notes (Signed)
Pt with SBP, surgery consulted, [facility] Please call surgery team when pt arrives.   Debbora PrestoMAGICK-Kenn Rekowski, MD  [facility] Pager [phone]  If 7PM-7AM, please contact night-coverage [email or web address] Password [email or web address]

## 2014-11-10 NOTE — ED Notes (Signed)
Pt's daughter Doran ClaySusan Cranor updated on pt status per pt's request. Contact # [phone]

## 2014-11-10 NOTE — ED Provider Notes (Signed)
CSN: [id number]     Arrival date & time 11/10/14  1125 History   First MD Initiated Contact with Patient 11/10/14 1138     Chief Complaint  Patient presents with  . Abdominal Pain     (Consider location/radiation/quality/duration/timing/severity/associated sxs/prior Treatment) Patient is a 81 y.o. male presenting with abdominal pain.  Abdominal Pain Pain location:  Epigastric and periumbilical Pain quality comment:  "sore" Pain radiates to:  Does not radiate Pain severity:  Moderate Onset quality:  Gradual Duration:  2 days Timing:  Constant Progression:  Worsening Chronicity:  New Context comment:  Began having pain 2 days ago. Nurse at his nursing facility gave him some kind of medication, which helped a little bit. He does not know what the medicine was. Worsened by:  Nothing tried (Has not tried to eat) Associated symptoms: constipation (Last BM 2-1/2 days ago.), nausea and vomiting   Associated symptoms: no diarrhea and no fever     Past Medical History  Diagnosis Date  . Atrial fibrillation (HCC)   . S/P CABG x 1   . DVT (deep venous thrombosis) (HCC)   . Hypertension   . Hypothyroid   . Macular degeneration    Past Surgical History  Procedure Laterality Date  . Coronary artery bypass graft    . Appendectomy     No family history on file. Social History  Substance Use Topics  . Smoking status: Former Games developer  . Smokeless tobacco: None  . Alcohol Use: Yes     Comment: occasional     Review of Systems  Constitutional: Negative for fever.  Gastrointestinal: Positive for nausea, vomiting, abdominal pain and constipation (Last BM 2-1/2 days ago.). Negative for diarrhea.  All other systems reviewed and are negative.     Allergies  Review of patient's allergies indicates no known allergies.  Home Medications   Prior to Admission medications   Medication Sig Start Date End Date Taking? Authorizing Provider  amLODipine (NORVASC) 5 MG tablet Take 5 mg by  mouth daily.   Yes Historical Provider, MD  atorvastatin (LIPITOR) 20 MG tablet Take 20 mg by mouth daily.   Yes Historical Provider, MD  Cholecalciferol 1000 UNITS capsule Take 1,000 Units by mouth daily. 02/14/14  Yes Historical Provider, MD  cyanocobalamin 2000 MCG tablet Take 2,000 mcg by mouth daily.   Yes Historical Provider, MD  donepezil (ARICEPT) 5 MG tablet Take 5 mg by mouth daily at 6 PM. 05/02/14  Yes Historical Provider, MD  levothyroxine (SYNTHROID, LEVOTHROID) 25 MCG tablet Take 25 mcg by mouth daily before breakfast.   Yes Historical Provider, MD  losartan-hydrochlorothiazide (HYZAAR) 100-12.5 MG tablet Take 1 tablet by mouth daily.   Yes Historical Provider, MD  multivitamin-lutein (OCUVITE-LUTEIN) CAPS capsule Take 1 capsule by mouth daily.   Yes Historical Provider, MD  omeprazole (PRILOSEC) 40 MG capsule Take 40 mg by mouth daily.   Yes Historical Provider, MD  warfarin (COUMADIN) 5 MG tablet Take 5 mg by mouth daily at 6 PM. 05/09/14 05/09/15 Yes Historical Provider, MD   BP 124/82 mmHg  Pulse 52  Temp(Src) 98.2 F (36.8 C) (Oral)  Resp 19  Ht  (1.702 m)  Wt 168 lb (76.204 kg)  BMI 26.31 kg/m2  SpO2 98% Physical Exam  Constitutional: He is oriented to person, place, and time. He appears well-developed and well-nourished. No distress.  HENT:  Head: Normocephalic and atraumatic.  Mouth/Throat: Oropharynx is clear and moist.  Eyes: Conjunctivae are normal. Pupils are equal, round,  and reactive to light. No scleral icterus.  Neck: Neck supple.  Cardiovascular: Normal rate, regular rhythm, normal heart sounds and intact distal pulses.   No murmur heard. Pulmonary/Chest: Effort normal and breath sounds normal. No stridor. No respiratory distress. He has no wheezes. He has no rales.  Abdominal: Soft. He exhibits no distension. There is tenderness in the epigastric area and periumbilical area. There is no rigidity, no rebound and no guarding.  Musculoskeletal: Normal  range of motion. He exhibits no edema.  Neurological: He is alert and oriented to person, place, and time.  Skin: Skin is warm and dry. No rash noted.  Psychiatric: He has a normal mood and affect. His behavior is normal.  Nursing note and vitals reviewed.   ED Course  Procedures (including critical care time) Labs Review Labs Reviewed  CBC WITH DIFFERENTIAL/PLATELET - Abnormal; Notable for the following:    WBC 15.3 (*)    Neutro Abs 13.4 (*)    Lymphs Abs 0.6 (*)    Monocytes Absolute 1.2 (*)    All other components within normal limits  COMPREHENSIVE METABOLIC PANEL - Abnormal; Notable for the following:    Glucose, Bld 151 (*)    BUN 41 (*)    Creatinine, Ser 2.02 (*)    Total Bilirubin 2.3 (*)    GFR calc non Af Amer 26 (*)    GFR calc Af Amer 31 (*)    All other components within normal limits  URINALYSIS, ROUTINE W REFLEX MICROSCOPIC (NOT AT [facility]) - Abnormal; Notable for the following:    Hgb urine dipstick TRACE (*)    All other components within normal limits  PROTIME-INR - Abnormal; Notable for the following:    Prothrombin Time 37.8 (*)    INR 3.97 (*)    All other components within normal limits  LIPASE, BLOOD  URINE MICROSCOPIC-ADD ON  I-STAT CG4 LACTIC ACID, ED  I-STAT CG4 LACTIC ACID, ED    Imaging Review Ct Abdomen Pelvis Wo Contrast  11/10/2014  CLINICAL DATA:  Upper abdominal pain x 3 days with N/V; oral contrast only - GFR 26 EXAM: CT ABDOMEN AND PELVIS WITHOUT CONTRAST TECHNIQUE: Multidetector CT imaging of the abdomen and pelvis was performed following the standard protocol without IV contrast. COMPARISON:  06/08/2012 FINDINGS: Lower chest: There are fibrotic changes at the lung bases bilaterally. Small calcified nodule is identified at the right lung base consistent with granuloma. The heart is enlarged. There is extensive coronary artery calcification. No pericardial effusion. Upper abdomen: No focal abnormality identified within the liver, spleen,  pancreas, or adrenal glands. Low-attenuation lesions are identified within the kidneys consistent with cysts. A small hyperdense lesion is identified along the anterior midpole of the right kidney, consistent with cyst based on stability and previous contrast-enhanced exams. Gallbladder is present. Layering high attenuation stones or sludge identified within the gallbladder. Gastrointestinal tract: The stomach has a normal appearance but is distended with contrast. There is dilatation of proximal and mid small bowel loops which measure up to 4.6 cm in the left mid abdomen. Point of obstruction appears to the left mid to lower abdomen. There is the appearance of a twist within the mesentery. Findings favor internal hernia or adhesion as a cause for the obstruction. Contrast does not reach the nondilated distal small bowel loops. There is a small amount of fluid within the small bowel mesenteric, surgically within the left mid abdomen. There are numerous colonic diverticula but no CT evidence for acute diverticulitis. The appendix  is surgically absent. Pelvis: Urinary bladder, prostate gland, and seminal vesicles have a normal appearance. Retroperitoneum: There is dense atherosclerotic calcification of the abdominal aorta. No aneurysm. No retroperitoneal or mesenteric adenopathy. Abdominal wall: Unremarkable. Osseous structures: Moderate to significant degenerative changes are identified within the entire lumbar spine. Vacuum disc phenomenon identified at numerous levels. No suspicious lytic or blastic lesions are identified. IMPRESSION: 1. Small bowel obstruction secondary to adhesions or internal hernia best localized to the mid small bowel in the left lower quadrant. No mass identified is source of obstruction. 2. Significant colonic diverticulosis. 3. Fibrotic changes at the lung bases. 4. Cardiomegaly and coronary artery disease. 5. Bilateral renal cysts. 6. Layering stones or sludge within the gallbladder. 7.  Significant degenerative change within the spine. Electronically Signed   By: Norva PavlovElizabeth  Brown M.D.   On: 11/10/2014 14:29   I have personally reviewed and evaluated these images and lab results as part of my medical decision-making.   EKG Interpretation   Date/Time:  Thursday November 10 2014 12:44:32 EDT Ventricular Rate:  100 PR Interval:    QRS Duration: 100 QT Interval:  358 QTC Calculation: 461 R Axis:   -13 Text Interpretation:  Atrial fibrillation with premature ventricular or  aberrantly conducted complexes Nonspecific ST and T wave abnormality  Abnormal ECG Confirmed by [facility]OFF[facility]  MD, TREY ([id number]) on 11/10/2014 3:40:02  PM      MDM   Final diagnoses:  Small bowel obstruction (HCC)  Acute renal insufficiency    81 year old male with 2 days of abdominal pain.  CT shows acute small bowel obstruction.  In general, he is nontoxic-appearing and hemodynamically stable. He also has acute renal insufficiency. Given IV fluids, will start NG tube. Discussed case with Dr. Carolynne Edouardoth (general surgery). Also discussed case with Dr. Izola PriceMyers ([facility]) [facility]    Blake DivineJohn Cathyann Kilfoyle, MD 11/10/14 [phone]

## 2014-11-10 NOTE — ED Notes (Signed)
Pt drinking oral contrast for CT.

## 2014-11-10 NOTE — ED Notes (Signed)
Patient transported to CT 

## 2014-11-10 NOTE — ED Notes (Signed)
Upper abdominal pain x 3 days with N/V.

## 2014-11-11 ENCOUNTER — Inpatient Hospital Stay (HOSPITAL_COMMUNITY): Payer: Medicare HMO

## 2014-11-11 ENCOUNTER — Encounter (HOSPITAL_COMMUNITY): Payer: Self-pay | Admitting: General Surgery

## 2014-11-11 DIAGNOSIS — I10 Essential (primary) hypertension: Secondary | ICD-10-CM

## 2014-11-11 DIAGNOSIS — N179 Acute kidney failure, unspecified: Secondary | ICD-10-CM

## 2014-11-11 DIAGNOSIS — K5669 Other intestinal obstruction: Secondary | ICD-10-CM

## 2014-11-11 LAB — COMPREHENSIVE METABOLIC PANEL
ALT: 16 U/L — ABNORMAL LOW (ref 17–63)
ANION GAP: 9 (ref 5–15)
AST: 21 U/L (ref 15–41)
Albumin: 3.5 g/dL (ref 3.5–5.0)
Alkaline Phosphatase: 56 U/L (ref 38–126)
BUN: 47 mg/dL — ABNORMAL HIGH (ref 6–20)
CHLORIDE: 106 mmol/L (ref 101–111)
CO2: 26 mmol/L (ref 22–32)
Calcium: 8.8 mg/dL — ABNORMAL LOW (ref 8.9–10.3)
Creatinine, Ser: 1.71 mg/dL — ABNORMAL HIGH (ref 0.61–1.24)
GFR calc non Af Amer: 32 mL/min — ABNORMAL LOW (ref >60)
GFR, EST AFRICAN AMERICAN: 37 mL/min — AB (ref >60)
Glucose, Bld: 142 mg/dL — ABNORMAL HIGH (ref 65–99)
POTASSIUM: 3.9 mmol/L (ref 3.5–5.1)
Sodium: 141 mmol/L (ref 135–145)
Total Bilirubin: 2.9 mg/dL — ABNORMAL HIGH (ref 0.3–1.2)
Total Protein: 6.1 g/dL — ABNORMAL LOW (ref 6.5–8.1)

## 2014-11-11 LAB — PROTIME-INR
INR: 3.5 — AB (ref 0.00–1.49)
Prothrombin Time: 34.4 seconds — ABNORMAL HIGH (ref 11.6–15.2)

## 2014-11-11 LAB — CBC
HEMATOCRIT: 41 % (ref 39.0–52.0)
Hemoglobin: 13.8 g/dL (ref 13.0–17.0)
MCH: 31.4 pg (ref 26.0–34.0)
MCHC: 33.7 g/dL (ref 30.0–36.0)
MCV: 93.4 fL (ref 78.0–100.0)
Platelets: 206 10*3/uL (ref 150–400)
RBC: 4.39 MIL/uL (ref 4.22–5.81)
RDW: 14.4 % (ref 11.5–15.5)
WBC: 11.3 10*3/uL — AB (ref 4.0–10.5)

## 2014-11-11 LAB — GLUCOSE, CAPILLARY
GLUCOSE-CAPILLARY: 126 mg/dL — AB (ref 65–99)
GLUCOSE-CAPILLARY: 148 mg/dL — AB (ref 65–99)
GLUCOSE-CAPILLARY: 134 mg/dL — AB (ref 65–99)
Glucose-Capillary: 140 mg/dL — ABNORMAL HIGH (ref 65–99)

## 2014-11-11 LAB — APTT: APTT: 37 s (ref 24–37)

## 2014-11-11 MED ORDER — DIATRIZOATE MEGLUMINE & SODIUM 66-10 % PO SOLN
90.0000 mL | Freq: Once | ORAL | Status: AC
  Administered 2014-11-11: 90 mL via NASOGASTRIC
  Filled 2014-11-11: qty 90

## 2014-11-11 NOTE — Consult Note (Signed)
Reason for Consult: small bowel obstruction Referring Physician: Dr. Marylu Lund   HPI: Edward Fitzpatrick is a 81 year old male with a history of Afib, CAD, DVT, hypothyroidism, HTN, macular degeneration, open appendectomy transferred from [facility] with a small bowel obstruction noted on a non contrasted CT scan.  The patient reports vague abdominal pain for 1.5-2 weeks.  Appetite has been poor for 3-4 days and developed nausea and vomiting.  Last bowel movement was 10/26.  Denies passing flatus.  Denies previous bowel obstruction.  Symptoms were moderate.  Aggravated by po intake.  No alleviating factors.  No modifying factors.  Since admission, he had an NGT placed which has yielded 579m of dark bilious output.  He feels better since NGT placement, however, states its bothersome and wants it out. Renal function is improving.  INR is 3.5.   Off note, the patient lives with his wife at an independent living facility.    Past Medical History  Diagnosis Date  . Atrial fibrillation ([place]   . S/P CABG x 1   . DVT (deep venous thrombosis) ([place]   . Hypertension   . Hypothyroid   . Macular degeneration     Past Surgical History  Procedure Laterality Date  . Coronary artery bypass graft    . Appendectomy      History reviewed. No pertinent family history.  Social History:  reports that he has quit smoking. He does not have any smokeless tobacco history on file. He reports that he drinks alcohol. He reports that he does not use illicit drugs.  Allergies: No Known Allergies  Medications:  No current facility-administered medications on file prior to encounter.   Current Outpatient Prescriptions on File Prior to Encounter  Medication Sig Dispense Refill  . amLODipine (NORVASC) 5 MG tablet Take 5 mg by mouth daily.    .Marland Kitchenatorvastatin (LIPITOR) 20 MG tablet Take 20 mg by mouth daily.    . Cholecalciferol 1000 UNITS capsule Take 2,000 Units by mouth daily.     .Marland Kitchendonepezil (ARICEPT) 5 MG tablet  Take 5 mg by mouth daily at 6 PM.    . levothyroxine (SYNTHROID, LEVOTHROID) 25 MCG tablet Take 25 mcg by mouth daily before breakfast.    . omeprazole (PRILOSEC) 40 MG capsule Take 40 mg by mouth daily.    .Marland Kitchenwarfarin (COUMADIN) 5 MG tablet Take 5 mg by mouth daily at 6 PM.       Results for orders placed or performed during the hospital encounter of 11/10/14 (from the past 48 hour(s))  CBC with Differential/Platelet     Status: Abnormal   Collection Time: 11/10/14 12:20 PM  Result Value Ref Range   WBC 15.3 (H) 4.0 - 10.5 K/uL   RBC 4.77 4.22 - 5.81 MIL/uL   Hemoglobin 14.9 13.0 - 17.0 g/dL   HCT 43.7 39.0 - 52.0 %   MCV 91.6 78.0 - 100.0 fL   MCH 31.2 26.0 - 34.0 pg   MCHC 34.1 30.0 - 36.0 g/dL   RDW 14.3 11.5 - 15.5 %   Platelets 212 150 - 400 K/uL   Neutrophils Relative % 88 %   Neutro Abs 13.4 (H) 1.7 - 7.7 K/uL   Lymphocytes Relative 4 %   Lymphs Abs 0.6 (L) 0.7 - 4.0 K/uL   Monocytes Relative 8 %   Monocytes Absolute 1.2 (H) 0.1 - 1.0 K/uL   Eosinophils Relative 0 %   Eosinophils Absolute 0.0 0.0 - 0.7 K/uL   Basophils Relative  0 %   Basophils Absolute 0.0 0.0 - 0.1 K/uL  Comprehensive metabolic panel     Status: Abnormal   Collection Time: 11/10/14 12:20 PM  Result Value Ref Range   Sodium 140 135 - 145 mmol/L   Potassium 4.1 3.5 - 5.1 mmol/L   Chloride 103 101 - 111 mmol/L   CO2 25 22 - 32 mmol/L   Glucose, Bld 151 (H) 65 - 99 mg/dL   BUN 41 (H) 6 - 20 mg/dL   Creatinine, Ser 2.02 (H) 0.61 - 1.24 mg/dL   Calcium 9.0 8.9 - 10.3 mg/dL   Total Protein 6.7 6.5 - 8.1 g/dL   Albumin 3.7 3.5 - 5.0 g/dL   AST 28 15 - 41 U/L   ALT 18 17 - 63 U/L   Alkaline Phosphatase 64 38 - 126 U/L   Total Bilirubin 2.3 (H) 0.3 - 1.2 mg/dL   GFR calc non Af Amer 26 (L) >60 mL/min   GFR calc Af Amer 31 (L) >60 mL/min    Comment: (NOTE) The eGFR has been calculated using the CKD EPI equation. This calculation has not been validated in all clinical situations. eGFR's persistently  <60 mL/min signify possible Chronic Kidney Disease.    Anion gap 12 5 - 15  Urinalysis, Routine w reflex microscopic (not at [facility])     Status: Abnormal   Collection Time: 11/10/14 12:20 PM  Result Value Ref Range   Color, Urine YELLOW YELLOW   APPearance CLEAR CLEAR   Specific Gravity, Urine 1.019 1.005 - 1.030   pH 5.5 5.0 - 8.0   Glucose, UA NEGATIVE NEGATIVE mg/dL   Hgb urine dipstick TRACE (A) NEGATIVE   Bilirubin Urine NEGATIVE NEGATIVE   Ketones, ur NEGATIVE NEGATIVE mg/dL   Protein, ur NEGATIVE NEGATIVE mg/dL   Urobilinogen, UA 1.0 0.0 - 1.0 mg/dL   Nitrite NEGATIVE NEGATIVE   Leukocytes, UA NEGATIVE NEGATIVE  Lipase, blood     Status: None   Collection Time: 11/10/14 12:20 PM  Result Value Ref Range   Lipase 26 11 - 51 U/L    Comment: Please note change in reference range.  Protime-INR     Status: Abnormal   Collection Time: 11/10/14 12:20 PM  Result Value Ref Range   Prothrombin Time 37.8 (H) 11.6 - 15.2 seconds   INR 3.97 (H) 0.00 - 1.49  Urine microscopic-add on     Status: None   Collection Time: 11/10/14 12:20 PM  Result Value Ref Range   Squamous Epithelial / LPF RARE RARE   WBC, UA 0-2 <3 WBC/hpf   RBC / HPF 3-6 <3 RBC/hpf   Bacteria, UA RARE RARE   Urine-Other MUCOUS PRESENT   I-Stat CG4 Lactic Acid, ED     Status: None   Collection Time: 11/10/14 12:34 PM  Result Value Ref Range   Lactic Acid, Venous 1.81 0.5 - 2.0 mmol/L  CBC     Status: Abnormal   Collection Time: 11/10/14  8:58 PM  Result Value Ref Range   WBC 11.7 (H) 4.0 - 10.5 K/uL   RBC 4.53 4.22 - 5.81 MIL/uL   Hemoglobin 14.1 13.0 - 17.0 g/dL   HCT 41.8 39.0 - 52.0 %   MCV 92.3 78.0 - 100.0 fL   MCH 31.1 26.0 - 34.0 pg   MCHC 33.7 30.0 - 36.0 g/dL   RDW 14.3 11.5 - 15.5 %   Platelets 199 150 - 400 K/uL  Creatinine, serum     Status: Abnormal  Collection Time: 11/10/14  8:58 PM  Result Value Ref Range   Creatinine, Ser 1.86 (H) 0.61 - 1.24 mg/dL   GFR calc non Af Amer 29 (L) >60  mL/min   GFR calc Af Amer 34 (L) >60 mL/min    Comment: (NOTE) The eGFR has been calculated using the CKD EPI equation. This calculation has not been validated in all clinical situations. eGFR's persistently <60 mL/min signify possible Chronic Kidney Disease.   TSH     Status: None   Collection Time: 11/10/14  8:58 PM  Result Value Ref Range   TSH 0.761 0.350 - 4.500 uIU/mL  Comprehensive metabolic panel     Status: Abnormal   Collection Time: 11/11/14  5:28 AM  Result Value Ref Range   Sodium 141 135 - 145 mmol/L   Potassium 3.9 3.5 - 5.1 mmol/L   Chloride 106 101 - 111 mmol/L   CO2 26 22 - 32 mmol/L   Glucose, Bld 142 (H) 65 - 99 mg/dL   BUN 47 (H) 6 - 20 mg/dL   Creatinine, Ser 1.71 (H) 0.61 - 1.24 mg/dL   Calcium 8.8 (L) 8.9 - 10.3 mg/dL   Total Protein 6.1 (L) 6.5 - 8.1 g/dL   Albumin 3.5 3.5 - 5.0 g/dL   AST 21 15 - 41 U/L   ALT 16 (L) 17 - 63 U/L   Alkaline Phosphatase 56 38 - 126 U/L   Total Bilirubin 2.9 (H) 0.3 - 1.2 mg/dL   GFR calc non Af Amer 32 (L) >60 mL/min   GFR calc Af Amer 37 (L) >60 mL/min    Comment: (NOTE) The eGFR has been calculated using the CKD EPI equation. This calculation has not been validated in all clinical situations. eGFR's persistently <60 mL/min signify possible Chronic Kidney Disease.    Anion gap 9 5 - 15  CBC     Status: Abnormal   Collection Time: 11/11/14  5:28 AM  Result Value Ref Range   WBC 11.3 (H) 4.0 - 10.5 K/uL   RBC 4.39 4.22 - 5.81 MIL/uL   Hemoglobin 13.8 13.0 - 17.0 g/dL   HCT 41.0 39.0 - 52.0 %   MCV 93.4 78.0 - 100.0 fL   MCH 31.4 26.0 - 34.0 pg   MCHC 33.7 30.0 - 36.0 g/dL   RDW 14.4 11.5 - 15.5 %   Platelets 206 150 - 400 K/uL  Protime-INR     Status: Abnormal   Collection Time: 11/11/14  5:28 AM  Result Value Ref Range   Prothrombin Time 34.4 (H) 11.6 - 15.2 seconds   INR 3.50 (H) 0.00 - 1.49  APTT     Status: None   Collection Time: 11/11/14  5:28 AM  Result Value Ref Range   aPTT 37 24 - 37 seconds     Comment:        IF BASELINE aPTT IS ELEVATED, SUGGEST PATIENT RISK ASSESSMENT BE USED TO DETERMINE APPROPRIATE ANTICOAGULANT THERAPY.     Ct Abdomen Pelvis Wo Contrast  11/10/2014  CLINICAL DATA:  Upper abdominal pain x 3 days with N/V; oral contrast only - GFR 26 EXAM: CT ABDOMEN AND PELVIS WITHOUT CONTRAST TECHNIQUE: Multidetector CT imaging of the abdomen and pelvis was performed following the standard protocol without IV contrast. COMPARISON:  06/08/2012 FINDINGS: Lower chest: There are fibrotic changes at the lung bases bilaterally. Small calcified nodule is identified at the right lung base consistent with granuloma. The heart is enlarged. There is extensive coronary artery calcification.  No pericardial effusion. Upper abdomen: No focal abnormality identified within the liver, spleen, pancreas, or adrenal glands. Low-attenuation lesions are identified within the kidneys consistent with cysts. A small hyperdense lesion is identified along the anterior midpole of the right kidney, consistent with cyst based on stability and previous contrast-enhanced exams. Gallbladder is present. Layering high attenuation stones or sludge identified within the gallbladder. Gastrointestinal tract: The stomach has a normal appearance but is distended with contrast. There is dilatation of proximal and mid small bowel loops which measure up to 4.6 cm in the left mid abdomen. Point of obstruction appears to the left mid to lower abdomen. There is the appearance of a twist within the mesentery. Findings favor internal hernia or adhesion as a cause for the obstruction. Contrast does not reach the nondilated distal small bowel loops. There is a small amount of fluid within the small bowel mesenteric, surgically within the left mid abdomen. There are numerous colonic diverticula but no CT evidence for acute diverticulitis. The appendix is surgically absent. Pelvis: Urinary bladder, prostate gland, and seminal vesicles have a  normal appearance. Retroperitoneum: There is dense atherosclerotic calcification of the abdominal aorta. No aneurysm. No retroperitoneal or mesenteric adenopathy. Abdominal wall: Unremarkable. Osseous structures: Moderate to significant degenerative changes are identified within the entire lumbar spine. Vacuum disc phenomenon identified at numerous levels. No suspicious lytic or blastic lesions are identified. IMPRESSION: 1. Small bowel obstruction secondary to adhesions or internal hernia best localized to the mid small bowel in the left lower quadrant. No mass identified is source of obstruction. 2. Significant colonic diverticulosis. 3. Fibrotic changes at the lung bases. 4. Cardiomegaly and coronary artery disease. 5. Bilateral renal cysts. 6. Layering stones or sludge within the gallbladder. 7. Significant degenerative change within the spine. Electronically Signed   By: Nolon Nations M.D.   On: 11/10/2014 14:29    Review of Systems  All other systems reviewed and are negative.  Blood pressure 123/83, pulse 110, temperature 97.9 F (36.6 C), temperature source Oral, resp. rate 18, height 5' 7" (1.702 m), weight 72 kg (158 lb 11.7 oz), SpO2 97 %. Physical Exam  Constitutional: He is oriented to person, place, and time. He appears well-developed and well-nourished. No distress.  HENT:  Head: Normocephalic and atraumatic.  Neck: Normal range of motion. Neck supple.  Cardiovascular: Intact distal pulses.  Exam reveals no gallop and no friction rub.   No murmur heard. S1S2 irregularly irregular  Respiratory: Effort normal and breath sounds normal. No respiratory distress. He has no wheezes. He has no rales. He exhibits no tenderness.  GI: Soft. Bowel sounds are normal. He exhibits no distension and no mass. There is no tenderness. There is no rebound and no guarding.  Musculoskeletal: He exhibits no edema or tenderness.  Lymphadenopathy:    He has no cervical adenopathy.  Neurological: He is  alert and oriented to person, place, and time.  Skin: Skin is warm and dry. No rash noted. He is not diaphoretic. No erythema. No pallor.  Psychiatric: He has a normal mood and affect. His behavior is normal. Judgment and thought content normal.    Assessment/Plan: Small bowel obstruction-no hernias on exam, no evidence of a mass on CT scan.  Likely due to adhesions.  Will start the small bowel obstruction protocol.  Anticipate he will resolve with non operative treatment.  He is not sure whether he wants surgery should he fails conservative management. AKI-improving  CV-CAD/CABG, Afib, HTN FEN-IVF, may have ice chips VTE  prophylaxis -coumadin on hold, INR 3.5.  No signs of bleeding from NGT placement, would monitor that closely Dispo-inpatient   One[place] Arenas [facility] ANP-BC Pager [phone] 11/11/2014, 7:47 AM

## 2014-11-11 NOTE — Clinical Documentation Improvement (Signed)
  Internal Medicine  Can the diagnosis of acute renal failure be further specified?   Acute Tubular Necrosis  Acute Renal Cortical Necrosis  Acute Renal Medullary Necrosis  Other  Clinically Undetermined  Please exercise your independent, professional judgment when responding. A specific answer is not anticipated or expected.  Thank you, Doy MinceVangela Taviana Westergren, RN [phone] Clinical Documentation Specialist

## 2014-11-11 NOTE — Care Management Important Message (Signed)
Important Message  Patient Details  Name: Edward Fitzpatrick MRN: [id number] Date of Birth: 1916-07-14   Medicare Important Message Given:  Yes-second notification given    Renie OraHawkins, Asani Deniston Smith 11/11/2014, 2:48 PMImportant Message  Patient Details  Name: Edward Fitzpatrick MRN: [id number] Date of Birth: 1916-07-14   Medicare Important Message Given:  Yes-second notification given    Renie OraHawkins, Taunya Goral Smith 11/11/2014, 2:48 PM

## 2014-11-11 NOTE — Progress Notes (Addendum)
[facility] PROGRESS NOTE  Edward Fitzpatrick ZOX:[id number] DOB: 23-Sep-1916 DOA: 11/10/2014 PCP: Olivia Mackie, MD  HPI/Brief narrative 81 y.o. male with history of A Fib HTN Hypothyroid HLD dementia CAD DVT presents to the hospital with complaints of abdominal pain, found to have SBO. Patient was admitted for further work up.  Assessment/Plan: 1. SBO secondary to adhesions 1. NG tube in place 2. General Surgery following with recs for conservative management for now 3. Cont with decreased BS on exam 2. HTN 1. BP stable and controlled at present 2. Cont to monitor 3. Afib 1. Rate controlled 2. Had been on coumadin prior to admit, currently supertherapeutic at 3.5 3. Coumadin currently on hold 4. Hypothyroid 1. Continue thyroid replacement as tolerated 5. Acute renal failure, clinically undetermined 1. Cr improving with hydration 2. Cont to follow renal function 6. Dementia 1. Stable 2. Pleasantly demented 7. HLD 1. On statin 8. GERD 1. On protonix 9. DVT prophylaxis 1. SCD's  Code Status: Full Family Communication: Pt in room Disposition Plan: Pending   Consultants:  General Surgery  Procedures:    Antibiotics: Anti-infectives    None      HPI/Subjective: Pleasantly demented and confused  Objective: Filed Vitals:   11/10/14 1600 11/10/14 2132 11/11/14 0415 11/11/14 1411  BP: 159/55 118/76 123/83 124/80  Pulse: 49 77 110 67  Temp:  97.9 F (36.6 C) 97.9 F (36.6 C) 98.3 F (36.8 C)  TempSrc:  Oral Oral Oral  Resp: Height:      Weight:   72 kg (158 lb 11.7 oz)   SpO2: 91% 94% 97% 95%    Intake/Output Summary (Last 24 hours) at 11/11/14 1607 Last data filed at 11/11/14 1259  Gross per 24 hour  Intake 816.67 ml  Output   1550 ml  Net -733.33 ml   Filed Weights   11/10/14 1128 11/11/14 0415  Weight: 76.204 kg (168 lb) 72 kg (158 lb 11.7 oz)    Exam:   General:  Awake, in nad  Cardiovascular: regular, s1,  s2  Respiratory: normal resp effort, no wheezing  Abdomen: soft, NG in place, decreased BS  Musculoskeletal: perfused, no clubbing   Data Reviewed: Basic Metabolic Panel:  Recent Labs Lab 11/10/14 1220 11/10/14 2058 11/11/14 0528  NA 140  --  141  K 4.1  --  3.9  CL 103  --  106  CO2 25  --  26  GLUCOSE 151*  --  142*  BUN 41*  --  47*  CREATININE 2.02* 1.86* 1.71*  CALCIUM 9.0  --  8.8*   Liver Function Tests:  Recent Labs Lab 11/10/14 1220 11/11/14 0528  AST 28 21  ALT 18 16*  ALKPHOS 64 56  BILITOT 2.3* 2.9*  PROT 6.7 6.1*  ALBUMIN 3.7 3.5    Recent Labs Lab 11/10/14 1220  LIPASE 26   No results for input(s): AMMONIA in the last 168 hours. CBC:  Recent Labs Lab 11/10/14 1220 11/10/14 2058 11/11/14 0528  WBC 15.3* 11.7* 11.3*  NEUTROABS 13.4*  --   --   HGB 14.9 14.1 13.8  HCT 43.7 41.8 41.0  MCV 91.6 92.3 93.4  PLT 212 199 206   Cardiac Enzymes: No results for input(s): CKTOTAL, CKMB, CKMBINDEX, TROPONINI in the last 168 hours. BNP (last 3 results) No results for input(s): BNP in the last 8760 hours.  ProBNP (last 3 results) No results for input(s): PROBNP in the last 8760 hours.  CBG:  Recent Labs Lab 11/10/14 2247 11/11/14 0759 11/11/14 1242  GLUCAP 126* 148* 134*    No results found for this or any previous visit (from the past 240 hour(s)).   Studies: Ct Abdomen Pelvis Wo Contrast  11/10/2014  CLINICAL DATA:  Upper abdominal pain x 3 days with N/V; oral contrast only - GFR 26 EXAM: CT ABDOMEN AND PELVIS WITHOUT CONTRAST TECHNIQUE: Multidetector CT imaging of the abdomen and pelvis was performed following the standard protocol without IV contrast. COMPARISON:  06/08/2012 FINDINGS: Lower chest: There are fibrotic changes at the lung bases bilaterally. Small calcified nodule is identified at the right lung base consistent with granuloma. The heart is enlarged. There is extensive coronary artery calcification. No pericardial  effusion. Upper abdomen: No focal abnormality identified within the liver, spleen, pancreas, or adrenal glands. Low-attenuation lesions are identified within the kidneys consistent with cysts. A small hyperdense lesion is identified along the anterior midpole of the right kidney, consistent with cyst based on stability and previous contrast-enhanced exams. Gallbladder is present. Layering high attenuation stones or sludge identified within the gallbladder. Gastrointestinal tract: The stomach has a normal appearance but is distended with contrast. There is dilatation of proximal and mid small bowel loops which measure up to 4.6 cm in the left mid abdomen. Point of obstruction appears to the left mid to lower abdomen. There is the appearance of a twist within the mesentery. Findings favor internal hernia or adhesion as a cause for the obstruction. Contrast does not reach the nondilated distal small bowel loops. There is a small amount of fluid within the small bowel mesenteric, surgically within the left mid abdomen. There are numerous colonic diverticula but no CT evidence for acute diverticulitis. The appendix is surgically absent. Pelvis: Urinary bladder, prostate gland, and seminal vesicles have a normal appearance. Retroperitoneum: There is dense atherosclerotic calcification of the abdominal aorta. No aneurysm. No retroperitoneal or mesenteric adenopathy. Abdominal wall: Unremarkable. Osseous structures: Moderate to significant degenerative changes are identified within the entire lumbar spine. Vacuum disc phenomenon identified at numerous levels. No suspicious lytic or blastic lesions are identified. IMPRESSION: 1. Small bowel obstruction secondary to adhesions or internal hernia best localized to the mid small bowel in the left lower quadrant. No mass identified is source of obstruction. 2. Significant colonic diverticulosis. 3. Fibrotic changes at the lung bases. 4. Cardiomegaly and coronary artery disease.  5. Bilateral renal cysts. 6. Layering stones or sludge within the gallbladder. 7. Significant degenerative change within the spine. Electronically Signed   By: Norva PavlovElizabeth  Brown M.D.   On: 11/10/2014 14:29   Dg Abd Portable 1v-small Bowel Obstruction Protocol-initial, 8 Hr Delay  11/11/2014  CLINICAL DATA:  81 year old male -NG tube placement. EXAM: PORTABLE ABDOMEN - 1 VIEW COMPARISON:  None. FINDINGS: An NG tube is identified with tip overlying the proximal stomach. Distended small bowel loops are again noted. No other significant changes identified. IMPRESSION: NG tube with tip overlying the proximal stomach- recommend advancement. Electronically Signed   By: Harmon PierJeffrey  Hu M.D.   On: 11/11/2014 08:33    Scheduled Meds: . amLODipine  5 mg Oral Daily  . atorvastatin  20 mg Oral Daily  . donepezil  5 mg Oral q1800  . famotidine (PEPCID) IV  20 mg Intravenous QHS  . fluticasone  1 spray Each Nare BID  . folic acid  1 mg Oral Daily  . insulin aspart  0-15 Units Subcutaneous TID WC  . levothyroxine  25 mcg Intravenous Q24H  . lidocaine  15 mL Mouth/Throat Once  . multivitamin with minerals  1 tablet Oral Daily  . pantoprazole (PROTONIX) IV  80 mg Intravenous Q24H  . sodium chloride  3 mL Intravenous Q12H  . thiamine  100 mg Oral Daily   Continuous Infusions: . dextrose 5 % and 0.9% NaCl 50 mL/hr at 11/10/14 2146    Principal Problem:   Small bowel obstruction (HCC) Active Problems:   HTN (hypertension)   Atrial fibrillation (HCC)   Hypothyroidism   AKI (acute kidney injury) (HCC)   SBO (small bowel obstruction) (HCC)    Zephyr Ridley K  [facility] Pager [phone]. If 7PM-7AM, please contact night-coverage at [email or web address], password [facility] 11/11/2014, 4:07 PM  LOS: 1 day

## 2014-11-11 NOTE — Care Management Note (Signed)
Case Management Note  Patient Details  Name: Edward LoseGeorge Fitzpatrick MRN: [id number] Date of Birth: 1916-09-03  Subjective/Objective: 81 y/o m admitted w/abd pain. SBO.From indep liv-[place].  NG,possible surgery. PT cons.                 Action/Plan:d/c plan home.   Expected Discharge Date:                  Expected Discharge Plan:  Home w Home Health Services  In-House Referral:     Discharge planning Services  CM Consult  Post Acute Care Choice:    Choice offered to:     DME Arranged:    DME Agency:     HH Arranged:    HH Agency:     Status of Service:  In process, will continue to follow  Medicare Important Message Given:    Date Medicare IM Given:    Medicare IM give by:    Date Additional Medicare IM Given:    Additional Medicare Important Message give by:     If discussed at Long Length of Stay Meetings, dates discussed:    Additional Comments:  Lanier ClamMahabir, Rasheen Schewe, RN 11/11/2014, 1:42 PM

## 2014-11-12 LAB — CBC
HEMATOCRIT: 41.4 % (ref 39.0–52.0)
HEMOGLOBIN: 14.3 g/dL (ref 13.0–17.0)
MCH: 31.8 pg (ref 26.0–34.0)
MCHC: 34.5 g/dL (ref 30.0–36.0)
MCV: 92 fL (ref 78.0–100.0)
PLATELETS: 208 10*3/uL (ref 150–400)
RBC: 4.5 MIL/uL (ref 4.22–5.81)
RDW: 14 % (ref 11.5–15.5)
WBC: 6.6 10*3/uL (ref 4.0–10.5)

## 2014-11-12 LAB — BASIC METABOLIC PANEL
ANION GAP: 11 (ref 5–15)
BUN: 42 mg/dL — AB (ref 6–20)
CHLORIDE: 109 mmol/L (ref 101–111)
CO2: 23 mmol/L (ref 22–32)
Calcium: 8.7 mg/dL — ABNORMAL LOW (ref 8.9–10.3)
Creatinine, Ser: 1.38 mg/dL — ABNORMAL HIGH (ref 0.61–1.24)
GFR calc Af Amer: 48 mL/min — ABNORMAL LOW (ref >60)
GFR, EST NON AFRICAN AMERICAN: 42 mL/min — AB (ref >60)
GLUCOSE: 149 mg/dL — AB (ref 65–99)
POTASSIUM: 3.7 mmol/L (ref 3.5–5.1)
Sodium: 143 mmol/L (ref 135–145)

## 2014-11-12 LAB — GLUCOSE, CAPILLARY
GLUCOSE-CAPILLARY: 131 mg/dL — AB (ref 65–99)
Glucose-Capillary: 117 mg/dL — ABNORMAL HIGH (ref 65–99)

## 2014-11-12 LAB — HEMOGLOBIN A1C
Hgb A1c MFr Bld: 6.1 % — ABNORMAL HIGH (ref 4.8–5.6)
MEAN PLASMA GLUCOSE: 128 mg/dL

## 2014-11-12 MED ORDER — METOPROLOL TARTRATE 1 MG/ML IV SOLN: 5.0000 mg | Freq: Four times a day (QID) | INTRAVENOUS | Status: DC

## 2014-11-12 MED ORDER — METOPROLOL TARTRATE 1 MG/ML IV SOLN
5.0000 mg | Freq: Four times a day (QID) | INTRAVENOUS | Status: DC
  Administered 2014-11-12 – 2014-11-13 (×5): 5 mg via INTRAVENOUS
  Filled 2014-11-12 (×5): qty 5

## 2014-11-12 MED ORDER — LABETALOL HCL 5 MG/ML IV SOLN
5.0000 mg | INTRAVENOUS | Status: DC | PRN
  Filled 2014-11-12: qty 4

## 2014-11-12 NOTE — Progress Notes (Signed)
[facility] PROGRESS NOTE  Edward Fitzpatrick ZOX:[id number] DOB: 1916-10-15 DOA: 11/10/2014 PCP: Olivia Mackie, MD  HPI/Brief narrative 81 y.o. male with history of A Fib HTN Hypothyroid HLD dementia CAD DVT presents to the hospital with complaints of abdominal pain, found to have SBO. Patient was admitted for further work up.  Assessment/Plan: 1. SBO secondary to adhesions 1. NG tube had been kept in place 2. General Surgery is following with recs for conservative management 3. SBO appears to be resolving 4. NG tube to be d/c'd and pt tried on clears per Surgery recs 2. HTN 1. BP stable and controlled at present 2. Cont to monitor 3. Afib 1. Rate mildly increased 2. Had been on coumadin prior to admit, most recently supertherapeutic at 3.5 3. Coumadin currently on hold 4. Have added IV beta blocker to aide in rate control 4. Hypothyroid 1. Continue thyroid replacement as tolerated 5. Acute renal failure, clinically undetermined 1. Cr improving with hydration 2. Cont to follow renal function 6. Dementia 1. Stable 2. Pleasantly demented 7. HLD 1. On statin 8. GERD 1. On protonix 9. DVT prophylaxis 1. SCD's  Code Status: Full Family Communication: Pt in room Disposition Plan: Pending   Consultants:  General Surgery  Procedures:    Antibiotics: Anti-infectives    None      HPI/Subjective: Reports feeling better and wanting to eat  Objective: Filed Vitals:   11/11/14 1411 11/11/14 2023 11/12/14 0506 11/12/14 0956  BP: 124/80 116/85 132/75 122/82  Pulse: 67 113 108 126  Temp: 98.3 F (36.8 C) 98.2 F (36.8 C) 98 F (36.7 C)   TempSrc: Oral Oral Oral   Resp:  16 16   Height:      Weight:   72.7 kg (160 lb 4.4 oz)   SpO2: 95% 94% 98%     Intake/Output Summary (Last 24 hours) at 11/12/14 1334 Last data filed at 11/12/14 1230  Gross per 24 hour  Intake  507.5 ml  Output   1675 ml  Net -1167.5 ml   Filed Weights   11/10/14 1128 11/11/14 0415  11/12/14 0506  Weight: 76.204 kg (168 lb) 72 kg (158 lb 11.7 oz) 72.7 kg (160 lb 4.4 oz)    Exam:   General:  Awake, sitting in chair, in nad  Cardiovascular: regular, s1, s2  Respiratory: normal resp effort, no wheezing  Abdomen: soft, NG in place, decreased BS  Musculoskeletal: perfused, no clubbing, no cyanosis  Data Reviewed: Basic Metabolic Panel:  Recent Labs Lab 11/10/14 1220 11/10/14 2058 11/11/14 0528 11/12/14 0625  NA 140  --  141 143  K 4.1  --  3.9 3.7  CL 103  --  106 109  CO2 25  --  26 23  GLUCOSE 151*  --  142* 149*  BUN 41*  --  47* 42*  CREATININE 2.02* 1.86* 1.71* 1.38*  CALCIUM 9.0  --  8.8* 8.7*   Liver Function Tests:  Recent Labs Lab 11/10/14 1220 11/11/14 0528  AST 28 21  ALT 18 16*  ALKPHOS 64 56  BILITOT 2.3* 2.9*  PROT 6.7 6.1*  ALBUMIN 3.7 3.5    Recent Labs Lab 11/10/14 1220  LIPASE 26   No results for input(s): AMMONIA in the last 168 hours. CBC:  Recent Labs Lab 11/10/14 1220 11/10/14 2058 11/11/14 0528 11/12/14 0625  WBC 15.3* 11.7* 11.3* 6.6  NEUTROABS 13.4*  --   --   --   HGB 14.9 14.1 13.8 14.3  HCT 43.7  41.8 41.0 41.4  MCV 91.6 92.3 93.4 92.0  PLT 212 199 206 208   Cardiac Enzymes: No results for input(s): CKTOTAL, CKMB, CKMBINDEX, TROPONINI in the last 168 hours. BNP (last 3 results) No results for input(s): BNP in the last 8760 hours.  ProBNP (last 3 results) No results for input(s): PROBNP in the last 8760 hours.  CBG:  Recent Labs Lab 11/11/14 0759 11/11/14 1242 11/11/14 1739 11/12/14 0835 11/12/14 1144  GLUCAP 148* 134* 140* 131* 117*    No results found for this or any previous visit (from the past 240 hour(s)).   Studies: Ct Abdomen Pelvis Wo Contrast  11/10/2014  CLINICAL DATA:  Upper abdominal pain x 3 days with N/V; oral contrast only - GFR 26 EXAM: CT ABDOMEN AND PELVIS WITHOUT CONTRAST TECHNIQUE: Multidetector CT imaging of the abdomen and pelvis was performed following the  standard protocol without IV contrast. COMPARISON:  06/08/2012 FINDINGS: Lower chest: There are fibrotic changes at the lung bases bilaterally. Small calcified nodule is identified at the right lung base consistent with granuloma. The heart is enlarged. There is extensive coronary artery calcification. No pericardial effusion. Upper abdomen: No focal abnormality identified within the liver, spleen, pancreas, or adrenal glands. Low-attenuation lesions are identified within the kidneys consistent with cysts. A small hyperdense lesion is identified along the anterior midpole of the right kidney, consistent with cyst based on stability and previous contrast-enhanced exams. Gallbladder is present. Layering high attenuation stones or sludge identified within the gallbladder. Gastrointestinal tract: The stomach has a normal appearance but is distended with contrast. There is dilatation of proximal and mid small bowel loops which measure up to 4.6 cm in the left mid abdomen. Point of obstruction appears to the left mid to lower abdomen. There is the appearance of a twist within the mesentery. Findings favor internal hernia or adhesion as a cause for the obstruction. Contrast does not reach the nondilated distal small bowel loops. There is a small amount of fluid within the small bowel mesenteric, surgically within the left mid abdomen. There are numerous colonic diverticula but no CT evidence for acute diverticulitis. The appendix is surgically absent. Pelvis: Urinary bladder, prostate gland, and seminal vesicles have a normal appearance. Retroperitoneum: There is dense atherosclerotic calcification of the abdominal aorta. No aneurysm. No retroperitoneal or mesenteric adenopathy. Abdominal wall: Unremarkable. Osseous structures: Moderate to significant degenerative changes are identified within the entire lumbar spine. Vacuum disc phenomenon identified at numerous levels. No suspicious lytic or blastic lesions are  identified. IMPRESSION: 1. Small bowel obstruction secondary to adhesions or internal hernia best localized to the mid small bowel in the left lower quadrant. No mass identified is source of obstruction. 2. Significant colonic diverticulosis. 3. Fibrotic changes at the lung bases. 4. Cardiomegaly and coronary artery disease. 5. Bilateral renal cysts. 6. Layering stones or sludge within the gallbladder. 7. Significant degenerative change within the spine. Electronically Signed   By: Norva Pavlov M.D.   On: 11/10/2014 14:29   Dg Abd Portable 1v  11/11/2014  CLINICAL DATA:  SBO protocol, 8 hour film following contrast, prior appendectomy EXAM: PORTABLE ABDOMEN - 1 VIEW COMPARISON:  11/11/2014 at 0815 hours FINDINGS: Enteric tube terminates in the gastric cardia. Dilated loops of small bowel in the central abdomen. Contrast opacifies the ascending colon to the distal transverse colon. Degenerative changes of the lumbar spine. IMPRESSION: Enteric tube terminates in the gastric cardia. Dilated loops of small bowel in the central abdomen, unchanged. Contrast opacifies the ascending colon  to the distal transverse colon at 8 hours. Electronically Signed   By: Charline BillsSriyesh  Krishnan M.D.   On: 11/11/2014 19:24   Dg Abd Portable 1v-small Bowel Obstruction Protocol-initial, 8 Hr Delay  11/11/2014  CLINICAL DATA:  81 year old male -NG tube placement. EXAM: PORTABLE ABDOMEN - 1 VIEW COMPARISON:  None. FINDINGS: An NG tube is identified with tip overlying the proximal stomach. Distended small bowel loops are again noted. No other significant changes identified. IMPRESSION: NG tube with tip overlying the proximal stomach- recommend advancement. Electronically Signed   By: Harmon PierJeffrey  Hu M.D.   On: 11/11/2014 08:33    Scheduled Meds: . amLODipine  5 mg Oral Daily  . atorvastatin  20 mg Oral Daily  . donepezil  5 mg Oral q1800  . famotidine (PEPCID) IV  20 mg Intravenous QHS  . fluticasone  1 spray Each Nare BID  . folic  acid  1 mg Oral Daily  . insulin aspart  0-15 Units Subcutaneous TID WC  . levothyroxine  25 mcg Intravenous Q24H  . lidocaine  15 mL Mouth/Throat Once  . metoprolol  5 mg Intravenous 4 times per day  . multivitamin with minerals  1 tablet Oral Daily  . pantoprazole (PROTONIX) IV  80 mg Intravenous Q24H  . sodium chloride  3 mL Intravenous Q12H  . thiamine  100 mg Oral Daily   Continuous Infusions: . dextrose 5 % and 0.9% NaCl 50 mL/hr at 11/11/14 1700    Principal Problem:   Small bowel obstruction (HCC) Active Problems:   HTN (hypertension)   Atrial fibrillation (HCC)   Hypothyroidism   AKI (acute kidney injury) (HCC)   SBO (small bowel obstruction) (HCC)    Kasiah Manka K  [facility] Pager [phone]. If 7PM-7AM, please contact night-coverage at [email or web address], password [email or web address] 11/12/2014, 1:34 PM  LOS: 2 days

## 2014-11-12 NOTE — Progress Notes (Signed)
Subjective: No complaints,no abd pain, had bm  Objective: Vital signs in last 24 hours: Temp:  [98 F (36.7 C)-98.3 F (36.8 C)] 98 F (36.7 C) (10/29 0506) Pulse Rate:  [67-126] 126 (10/29 0956) Resp:  [16] 16 (10/29 0506) BP: (116-132)/(75-85) 122/82 mmHg (10/29 0956) SpO2:  [94 %-98 %] 98 % (10/29 0506) Weight:  [72.7 kg (160 lb 4.4 oz)] 72.7 kg (160 lb 4.4 oz) (10/29 0506) Last BM Date: 11/12/14  Intake/Output from previous day: 10/28 0701 - 10/29 0700 In: 1162.5 [I.V.:952.5; NG/GT:110; IV Piggyback:100] Out: 2075 [Urine:925; Emesis/NG output:1150] Intake/Output this shift:    GI: soft nt bs present  Lab Results:   Recent Labs  11/11/14 0528 11/12/14 0625  WBC 11.3* 6.6  HGB 13.8 14.3  HCT 41.0 41.4  PLT 206 208   BMET  Recent Labs  11/11/14 0528 11/12/14 0625  NA 141 143  K 3.9 3.7  CL 106 109  CO2 26 23  GLUCOSE 142* 149*  BUN 47* 42*  CREATININE 1.71* 1.38*  CALCIUM 8.8* 8.7*   PT/INR  Recent Labs  11/10/14 1220 11/11/14 0528  LABPROT 37.8* 34.4*  INR 3.97* 3.50*   ABG No results for input(s): PHART, HCO3 in the last 72 hours.  Invalid input(s): PCO2, PO2  Studies/Results: Ct Abdomen Pelvis Wo Contrast  11/10/2014  CLINICAL DATA:  Upper abdominal pain x 3 days with N/V; oral contrast only - GFR 26 EXAM: CT ABDOMEN AND PELVIS WITHOUT CONTRAST TECHNIQUE: Multidetector CT imaging of the abdomen and pelvis was performed following the standard protocol without IV contrast. COMPARISON:  06/08/2012 FINDINGS: Lower chest: There are fibrotic changes at the lung bases bilaterally. Small calcified nodule is identified at the right lung base consistent with granuloma. The heart is enlarged. There is extensive coronary artery calcification. No pericardial effusion. Upper abdomen: No focal abnormality identified within the liver, spleen, pancreas, or adrenal glands. Low-attenuation lesions are identified within the kidneys consistent with cysts. A  small hyperdense lesion is identified along the anterior midpole of the right kidney, consistent with cyst based on stability and previous contrast-enhanced exams. Gallbladder is present. Layering high attenuation stones or sludge identified within the gallbladder. Gastrointestinal tract: The stomach has a normal appearance but is distended with contrast. There is dilatation of proximal and mid small bowel loops which measure up to 4.6 cm in the left mid abdomen. Point of obstruction appears to the left mid to lower abdomen. There is the appearance of a twist within the mesentery. Findings favor internal hernia or adhesion as a cause for the obstruction. Contrast does not reach the nondilated distal small bowel loops. There is a small amount of fluid within the small bowel mesenteric, surgically within the left mid abdomen. There are numerous colonic diverticula but no CT evidence for acute diverticulitis. The appendix is surgically absent. Pelvis: Urinary bladder, prostate gland, and seminal vesicles have a normal appearance. Retroperitoneum: There is dense atherosclerotic calcification of the abdominal aorta. No aneurysm. No retroperitoneal or mesenteric adenopathy. Abdominal wall: Unremarkable. Osseous structures: Moderate to significant degenerative changes are identified within the entire lumbar spine. Vacuum disc phenomenon identified at numerous levels. No suspicious lytic or blastic lesions are identified. IMPRESSION: 1. Small bowel obstruction secondary to adhesions or internal hernia best localized to the mid small bowel in the left lower quadrant. No mass identified is source of obstruction. 2. Significant colonic diverticulosis. 3. Fibrotic changes at the lung bases. 4. Cardiomegaly and coronary artery disease. 5. Bilateral renal cysts. 6. Layering  stones or sludge within the gallbladder. 7. Significant degenerative change within the spine. Electronically Signed   By: Norva PavlovElizabeth  Brown M.D.   On:  11/10/2014 14:29   Dg Abd Portable 1v  11/11/2014  CLINICAL DATA:  SBO protocol, 8 hour film following contrast, prior appendectomy EXAM: PORTABLE ABDOMEN - 1 VIEW COMPARISON:  11/11/2014 at 0815 hours FINDINGS: Enteric tube terminates in the gastric cardia. Dilated loops of small bowel in the central abdomen. Contrast opacifies the ascending colon to the distal transverse colon. Degenerative changes of the lumbar spine. IMPRESSION: Enteric tube terminates in the gastric cardia. Dilated loops of small bowel in the central abdomen, unchanged. Contrast opacifies the ascending colon to the distal transverse colon at 8 hours. Electronically Signed   By: Charline BillsSriyesh  Krishnan M.D.   On: 11/11/2014 19:24   Dg Abd Portable 1v-small Bowel Obstruction Protocol-initial, 8 Hr Delay  11/11/2014  CLINICAL DATA:  81 year old male -NG tube placement. EXAM: PORTABLE ABDOMEN - 1 VIEW COMPARISON:  None. FINDINGS: An NG tube is identified with tip overlying the proximal stomach. Distended small bowel loops are again noted. No other significant changes identified. IMPRESSION: NG tube with tip overlying the proximal stomach- recommend advancement. Electronically Signed   By: Harmon PierJeffrey  Hu M.D.   On: 11/11/2014 08:33    Anti-infectives: Anti-infectives    None      Assessment/Plan: sbo resolving  Will dc ng today with soft abdomen xray with contrast in colon Start clears, advance to fulls tomorrow  University Health Care SystemWAKEFIELD,Aesha Agrawal 11/12/2014

## 2014-11-12 NOTE — Evaluation (Signed)
Physical Therapy Evaluation Patient Details Name: Edward Fitzpatrick MRN: [id number] DOB: [birth year] Today's Date: 11/12/2014   History of Present Illness  81 y.o. male with history of A Fib HTN Hypothyroid HLD dementia CAD DVT presents to the hospital with complaints of abdominal pain, found to have SBO. Pt treated non-surgically.  Clinical Impression  Pt admitted with above diagnosis. Pt currently with functional limitations due to the deficits listed below (see PT Problem List).  Pt will benefit from skilled PT to increase their independence and safety with mobility to allow discharge to the venue listed below.  Pt required encouragement to participate with PT and limited only to SPT to recliner.  Pt states he is too weak to attempt ambulation, so did not have nurse undo NG suction.  Pt transferred at Min/guard level with cues for safety.  Will need to further assess ambulation.  Pt's wife has 24 hour caregiver and pt states that they can assist him if needed (although he states he will not need their help).  Recommend HHPT in addition to S for mobility from caregivers.     Follow Up Recommendations Home health PT;Supervision/Assistance - 24 hour    Equipment Recommendations  None recommended by PT    Recommendations for Other Services       Precautions / Restrictions Precautions Precautions: Fall Precaution Comments: NG tube      Mobility  Bed Mobility Overal bed mobility: Needs Assistance Bed Mobility: Supine to Sit     Supine to sit: Supervision;HOB elevated     General bed mobility comments: Use of rail with HOB elevated  Transfers Overall transfer level: Needs assistance Equipment used: 1 person hand held assist Transfers: Sit to/from [employer] Sit to Stand: Min guard Stand pivot transfers: Min guard       General transfer comment: Pt moving impulsively and needing cues to slow down to maintain NG tube and IV in place. Once in chair, pt wanted to  return to bed.  Pt encouraged to sit up and educated on MD wanting pt to be up OOB.  Pt then more agreeable.  Ambulation/Gait             General Gait Details: Deferred due to NG tube and pt feeling too weak.  Stairs            Wheelchair Mobility    Modified Rankin (Stroke Patients Only)       Balance Overall balance assessment: Needs assistance         Standing balance support: Single extremity supported Standing balance-Leahy Scale: Poor                               Pertinent Vitals/Pain Pain Assessment: No/denies pain    Home Living Family/patient expects to be discharged to:: Private residence (Independent) Living Arrangements: Spouse/significant other;Other (Comment) (24 hour caregiver for wife) Available Help at Discharge: Available 24 hours/day;Personal care attendant Type of Home: Independent living facility Home Access: Elevator     Home Layout: One level Home Equipment: Cane - single point;Walker - 2 wheels;Grab bars - tub/shower;Grab bars - toilet      Prior Function Level of Independence: Independent with assistive device(s)         Comments: Normally uses cane, but will use RW for longer distances     Hand Dominance   Dominant Hand: Right    Extremity/Trunk Assessment   Upper Extremity Assessment: Overall WFL for tasks  assessed           Lower Extremity Assessment: Generalized weakness      Cervical / Trunk Assessment: Normal  Communication   Communication: No difficulties  Cognition Arousal/Alertness: Awake/alert Behavior During Therapy: WFL for tasks assessed/performed Overall Cognitive Status: No family/caregiver present to determine baseline cognitive functioning Area of Impairment: Safety/judgement         Safety/Judgement: Decreased awareness of deficits;Decreased awareness of safety     General Comments: Notes state pt has hx of dementia.  Pt oriented to person, place, time, but did  demonstrate decreased insight at times.    General Comments General comments (skin integrity, edema, etc.): Pt required encouragement to get OOB. States he hasn't eaten in 3-4 days and is too weak, but then poor insight stating he wants to go home today and he thinks he would be strong enough.    Exercises        Assessment/Plan    PT Assessment Patient needs continued PT services  PT Diagnosis Difficulty walking;Generalized weakness   PT Problem List Decreased activity tolerance;Decreased balance;Decreased mobility  PT Treatment Interventions Gait training;Functional mobility training;Therapeutic activities;Therapeutic exercise;DME instruction   PT Goals (Current goals can be found in the Care Plan section) Acute Rehab PT Goals Patient Stated Goal: go home PT Goal Formulation: With patient Time For Goal Achievement: 11/26/14 Potential to Achieve Goals: Good    Frequency Min 3X/week   Barriers to discharge        Co-evaluation               End of Session   Activity Tolerance: Patient limited by fatigue Patient left: in chair;with call bell/phone within reach;with chair alarm set Nurse Communication: Mobility status         Time: [id number] PT Time Calculation (min) (ACUTE ONLY): 21 min   Charges:   PT Evaluation $Initial PT Evaluation Tier I: 1 Procedure     PT G Codes:        Edward Fitzpatrick 11/12/2014, 9:17 AM

## 2014-11-13 DIAGNOSIS — I480 Paroxysmal atrial fibrillation: Secondary | ICD-10-CM

## 2014-11-13 LAB — BASIC METABOLIC PANEL
ANION GAP: 6 (ref 5–15)
BUN: 38 mg/dL — ABNORMAL HIGH (ref 6–20)
CALCIUM: 8.2 mg/dL — AB (ref 8.9–10.3)
CO2: 27 mmol/L (ref 22–32)
Chloride: 110 mmol/L (ref 101–111)
Creatinine, Ser: 1.11 mg/dL (ref 0.61–1.24)
GFR calc Af Amer: >60 mL/min (ref >60)
GFR, EST NON AFRICAN AMERICAN: 54 mL/min — AB (ref >60)
Glucose, Bld: 125 mg/dL — ABNORMAL HIGH (ref 65–99)
POTASSIUM: 3.6 mmol/L (ref 3.5–5.1)
Sodium: 143 mmol/L (ref 135–145)

## 2014-11-13 LAB — PROTIME-INR
INR: 3.18 — AB (ref 0.00–1.49)
Prothrombin Time: 32 seconds — ABNORMAL HIGH (ref 11.6–15.2)

## 2014-11-13 MED ORDER — METOPROLOL TARTRATE 25 MG PO TABS
25.0000 mg | ORAL_TABLET | Freq: Two times a day (BID) | ORAL | Status: DC
  Administered 2014-11-13: 25 mg via ORAL
  Filled 2014-11-13: qty 1

## 2014-11-13 NOTE — Progress Notes (Signed)
Patient ID: Edward LoseGeorge Fitzpatrick, male   DOB: 09-12-1916, 81 y.o.   MRN: [id number]    Subjective: No complaints,eating "seconds" on his clear liquids.  No n/v.  Had 4 stools yesterday.    Objective: Vital signs in last 24 hours: Temp:  [98 F (36.7 C)-98.8 F (37.1 C)] 98.8 F (37.1 C) (10/30 0612) Pulse Rate:  [83-132] 83 (10/30 0612) Resp:  [18] 18 (10/30 0452) BP: (111-136)/(68-82) 111/68 mmHg (10/30 0612) SpO2:  [98 %-99 %] 98 % (10/30 0452) Weight:  [69.3 kg (152 lb 12.5 oz)] 69.3 kg (152 lb 12.5 oz) (10/30 0452) Last BM Date: 11/12/14  Intake/Output from previous day: 10/29 0701 - 10/30 0700 In: 1955.8 [P.O.:1320; I.V.:535.8; IV Piggyback:100] Out: 675 [Urine:475; Emesis/NG output:200] Intake/Output this shift:   Gen - sitting up in bed.   GI: soft nt present, non distended.    Lab Results:   Recent Labs  11/11/14 0528 11/12/14 0625  WBC 11.3* 6.6  HGB 13.8 14.3  HCT 41.0 41.4  PLT 206 208   BMET  Recent Labs  11/12/14 0625 11/13/14 0601  NA 143 143  K 3.7 3.6  CL 109 110  CO2 23 27  GLUCOSE 149* 125*  BUN 42* 38*  CREATININE 1.38* 1.11  CALCIUM 8.7* 8.2*   PT/INR  Recent Labs  11/11/14 0528 11/13/14 0601  LABPROT 34.4* 32.0*  INR 3.50* 3.18*   ABG No results for input(s): PHART, HCO3 in the last 72 hours.  Invalid input(s): PCO2, PO2  Studies/Results: Dg Abd Portable 1v  11/11/2014  CLINICAL DATA:  SBO protocol, 8 hour film following contrast, prior appendectomy EXAM: PORTABLE ABDOMEN - 1 VIEW COMPARISON:  11/11/2014 at 0815 hours FINDINGS: Enteric tube terminates in the gastric cardia. Dilated loops of small bowel in the central abdomen. Contrast opacifies the ascending colon to the distal transverse colon. Degenerative changes of the lumbar spine. IMPRESSION: Enteric tube terminates in the gastric cardia. Dilated loops of small bowel in the central abdomen, unchanged. Contrast opacifies the ascending colon to the distal transverse colon at 8  hours. Electronically Signed   By: Charline BillsSriyesh  Krishnan M.D.   On: 11/11/2014 19:24    Anti-infectives: Anti-infectives    None      Assessment/Plan: Sbo, resolving.  Full liquids.  Continue advancing diet as tolerated.  Call if he does not progress.    Edward Fitzpatrick 11/13/2014

## 2014-11-13 NOTE — Progress Notes (Signed)
[facility] PROGRESS NOTE  Edward Fitzpatrick ZOX:[id number] DOB: September 24, 1916 DOA: 11/10/2014 PCP: Olivia Mackie, MD  HPI/Brief narrative 81 y.o. male with history of A Fib HTN Hypothyroid HLD dementia CAD DVT presents to the hospital with complaints of abdominal pain, found to have SBO. Patient was admitted for further work up.  Assessment/Plan: 1. SBO secondary to adhesions 1. Had required NG to low wall suction, since d/c'd by Surgery 2. General Surgery was consulted with recs for conservative management 3. SBO appears to be resolving and patient is tolerating full liquid diet 4. Continue to advance diet as tolerated 2. HTN 1. BP stable and controlled at present 2. Cont to monitor 3. Afib 1. Earlier, rate was mildly increased 2. Had been on coumadin prior to admit, most recently supertherapeutic at 3.1 3. Coumadin currently on hold 4. Hypothyroid 1. Continue thyroid replacement as tolerated 5. Acute renal failure, clinically undetermined 1. Cr improved with hydration 2. Cont to follow renal function 6. Hx of Dementia 1. Stable 2. Seems alert, oriented and appropriately conversant at this time 7. HLD 1. On statin 8. GERD 1. On protonix 9. DVT prophylaxis 1. SCD's  Code Status: Full Family Communication: Pt in room Disposition Plan: Pending   Consultants:  General Surgery   Antibiotics: Anti-infectives    None      HPI/Subjective: Tolerating diet thus far. Denies abd pain. Eager to return to facility  Objective: Filed Vitals:   11/12/14 2313 11/13/14 0452 11/13/14 0612 11/13/14 1350  BP: 129/69 118/71 111/68 129/93  Pulse: 87 112 83 92  Temp:   98.8 F (37.1 C) 97.9 F (36.6 C)  TempSrc:  Oral Oral Oral  Resp:  18  20  Height:      Weight:  69.3 kg (152 lb 12.5 oz)    SpO2:  98%  99%    Intake/Output Summary (Last 24 hours) at 11/13/14 1455 Last data filed at 11/13/14 0900  Gross per 24 hour  Intake 2335.83 ml  Output    400 ml  Net 1935.83  ml   Filed Weights   11/11/14 0415 11/12/14 0506 11/13/14 0452  Weight: 72 kg (158 lb 11.7 oz) 72.7 kg (160 lb 4.4 oz) 69.3 kg (152 lb 12.5 oz)    Exam:   General:  Awake, sitting in chair, in nad  Cardiovascular: regular, s1, s2  Respiratory: normal resp effort, no wheezing  Abdomen: soft, pos BS  Musculoskeletal: perfused, no clubbing, no cyanosis  Data Reviewed: Basic Metabolic Panel:  Recent Labs Lab 11/10/14 1220 11/10/14 2058 11/11/14 0528 11/12/14 0625 11/13/14 0601  NA 140  --  141 143 143  K 4.1  --  3.9 3.7 3.6  CL 103  --  106 109 110  CO2 25  --  GLUCOSE 151*  --  142* 149* 125*  BUN 41*  --  47* 42* 38*  CREATININE 2.02* 1.86* 1.71* 1.38* 1.11  CALCIUM 9.0  --  8.8* 8.7* 8.2*   Liver Function Tests:  Recent Labs Lab 11/10/14 1220 11/11/14 0528  AST 28 21  ALT 18 16*  ALKPHOS 64 56  BILITOT 2.3* 2.9*  PROT 6.7 6.1*  ALBUMIN 3.7 3.5    Recent Labs Lab 11/10/14 1220  LIPASE 26   No results for input(s): AMMONIA in the last 168 hours. CBC:  Recent Labs Lab 11/10/14 1220 11/10/14 2058 11/11/14 0528 11/12/14 0625  WBC 15.3* 11.7* 11.3* 6.6  NEUTROABS 13.4*  --   --   --  HGB 14.9 14.1 13.8 14.3  HCT 43.7 41.8 41.0 41.4  MCV 91.6 92.3 93.4 92.0  PLT 212 199 206 208   Cardiac Enzymes: No results for input(s): CKTOTAL, CKMB, CKMBINDEX, TROPONINI in the last 168 hours. BNP (last 3 results) No results for input(s): BNP in the last 8760 hours.  ProBNP (last 3 results) No results for input(s): PROBNP in the last 8760 hours.  CBG:  Recent Labs Lab 11/11/14 0759 11/11/14 1242 11/11/14 1739 11/12/14 0835 11/12/14 1144  GLUCAP 148* 134* 140* 131* 117*    No results found for this or any previous visit (from the past 240 hour(s)).   Studies: Dg Abd Portable 1v  11/11/2014  CLINICAL DATA:  SBO protocol, 8 hour film following contrast, prior appendectomy EXAM: PORTABLE ABDOMEN - 1 VIEW COMPARISON:  11/11/2014 at  0815 hours FINDINGS: Enteric tube terminates in the gastric cardia. Dilated loops of small bowel in the central abdomen. Contrast opacifies the ascending colon to the distal transverse colon. Degenerative changes of the lumbar spine. IMPRESSION: Enteric tube terminates in the gastric cardia. Dilated loops of small bowel in the central abdomen, unchanged. Contrast opacifies the ascending colon to the distal transverse colon at 8 hours. Electronically Signed   By: Charline BillsSriyesh  Krishnan M.D.   On: 11/11/2014 19:24    Scheduled Meds: . amLODipine  5 mg Oral Daily  . atorvastatin  20 mg Oral Daily  . donepezil  5 mg Oral q1800  . famotidine (PEPCID) IV  20 mg Intravenous QHS  . fluticasone  1 spray Each Nare BID  . folic acid  1 mg Oral Daily  . levothyroxine  25 mcg Intravenous Q24H  . lidocaine  15 mL Mouth/Throat Once  . metoprolol tartrate  25 mg Oral BID  . multivitamin with minerals  1 tablet Oral Daily  . pantoprazole (PROTONIX) IV  80 mg Intravenous Q24H  . sodium chloride  3 mL Intravenous Q12H  . thiamine  100 mg Oral Daily   Continuous Infusions: . dextrose 5 % and 0.9% NaCl 50 mL/hr at 11/13/14 0623    Principal Problem:   Small bowel obstruction (HCC) Active Problems:   HTN (hypertension)   Atrial fibrillation (HCC)   Hypothyroidism   AKI (acute kidney injury) (HCC)   SBO (small bowel obstruction) (HCC)    [name]  [facility] Pager [phone]. If 7PM-7AM, please contact night-coverage at [email or web address], password [email or web address] 11/13/2014, 2:55 PM  LOS: 3 days

## 2014-11-14 LAB — BASIC METABOLIC PANEL
Anion gap: 9 (ref 5–15)
BUN: 33 mg/dL — AB (ref 6–20)
CHLORIDE: 109 mmol/L (ref 101–111)
CO2: 22 mmol/L (ref 22–32)
CREATININE: 1.03 mg/dL (ref 0.61–1.24)
Calcium: 8.4 mg/dL — ABNORMAL LOW (ref 8.9–10.3)
GFR calc Af Amer: >60 mL/min (ref >60)
GFR, EST NON AFRICAN AMERICAN: 60 mL/min — AB (ref >60)
GLUCOSE: 150 mg/dL — AB (ref 65–99)
POTASSIUM: 3.8 mmol/L (ref 3.5–5.1)
SODIUM: 140 mmol/L (ref 135–145)

## 2014-11-14 LAB — PROTIME-INR
INR: 2.07 — ABNORMAL HIGH (ref 0.00–1.49)
PROTHROMBIN TIME: 23.2 s — AB (ref 11.6–15.2)

## 2014-11-14 MED ORDER — AMLODIPINE BESYLATE 2.5 MG PO TABS
2.5000 mg | ORAL_TABLET | Freq: Every day | ORAL | Status: DC
Start: 2014-11-14 — End: 2014-11-14
  Administered 2014-11-14: 2.5 mg via ORAL
  Filled 2014-11-14: qty 1

## 2014-11-14 MED ORDER — METOPROLOL TARTRATE 50 MG PO TABS
50.0000 mg | ORAL_TABLET | Freq: Two times a day (BID) | ORAL | Status: DC
Start: 2014-11-14 — End: 2014-11-20

## 2014-11-14 MED ORDER — AMLODIPINE BESYLATE 2.5 MG PO TABS: 2.5000 mg | ORAL_TABLET | Freq: Every day | ORAL | Status: DC

## 2014-11-14 MED ORDER — METOPROLOL TARTRATE 50 MG PO TABS
50.0000 mg | ORAL_TABLET | Freq: Two times a day (BID) | ORAL | Status: DC
  Administered 2014-11-14: 50 mg via ORAL
  Filled 2014-11-14: qty 1

## 2014-11-14 NOTE — Progress Notes (Signed)
Pt discharged from hospital with dc instructions and prescriptions.[place] [facility] via [facility] transporter.  Maeola Harmanark, Takeysha Bonk Johnson

## 2014-11-14 NOTE — Discharge Summary (Signed)
Physician Discharge Summary  Edward Fitzpatrick ZOX:[id number] DOB: [birth year] DOA: 11/10/2014  PCP: Olivia Mackie, MD  Admit date: 11/10/2014 Discharge date: 11/14/2014  Time spent: 20 minutes  Recommendations for Outpatient Follow-up:  1. Follow up with PCP in 1-2 weeks 2. Follow up INR within 5 days   Discharge Diagnoses:  Principal Problem:   Small bowel obstruction (HCC) Active Problems:   HTN (hypertension)   Atrial fibrillation (HCC)   Hypothyroidism   AKI (acute kidney injury) (HCC)   SBO (small bowel obstruction) (HCC)   Discharge Condition: Improved  Diet recommendation: Heart healthy  Filed Weights   11/12/14 0506 11/13/14 0452 11/14/14 0550  Weight: 72.7 kg (160 lb 4.4 oz) 69.3 kg (152 lb 12.5 oz) 69.9 kg (154 lb 1.6 oz)    History of present illness:  Please review dictated H and P from 10/27 for details. Briefly, 81 y.o. male with history of a-fib HTN Hypothyroid HLD dementia CAD DVT presents to the hospital with complaints of abdominal pain, found to have SBO. Patient was admitted for further work up.  Hospital Course:  1. SBO secondary to adhesions 1. Had initially required NG to low wall suction with bowel rest, NG tube since d/c'd by Surgery and diet successfully advanced 2. SBO appears to be resolved and patient is tolerating a regular diet 2. HTN 1. BP remained stable and controlled 2. Cont to monitor 3. Afib with RVR 1. Noted to be in RVR during this admission 2. Had been on coumadin prior to admit, found to be supertherapeutic and coumadin held 3. Would resume coumadin on d/c and follow up INR closely 4. Hypothyroid 1. Continued thyroid replacement as tolerated 5. Acute renal failure, clinically undetermined 1. Cr improved with hydration 2. Cont to follow renal function 6. Hx of Dementia 1. Stable 2. Seems alert, oriented and appropriately conversant at this time 7. HLD 1. On statin 8. GERD 1. On protonix 9. DVT  prophylaxis 1. SCD's  Consultations:  General Surgery  Discharge Exam: Filed Vitals:   11/13/14 0612 11/13/14 1350 11/13/14 2120 11/14/14 0550  BP: 111/68 129/93 116/64 120/67  Pulse: 83 92 98 87  Temp: 98.8 F (37.1 C) 97.9 F (36.6 C) 97.1 F (36.2 C) 97.2 F (36.2 C)  TempSrc: Oral Oral Axillary Oral  Resp:  Height:      Weight:    69.9 kg (154 lb 1.6 oz)  SpO2:  99% 99% 99%    General: awake, in nad Cardiovascular: regular, s1, s2 Respiratory: normal resp effort, no wheezing  Discharge Instructions     Medication List    STOP taking these medications        losartan-hydrochlorothiazide 100-12.5 MG tablet  Commonly known as:  HYZAAR      TAKE these medications        acetaminophen 500 MG tablet  Commonly known as:  TYLENOL  Take 1,000 mg by mouth every 6 (six) hours as needed for mild pain or headache.     amLODipine 2.5 MG tablet  Commonly known as:  NORVASC  Take 1 tablet (2.5 mg total) by mouth daily.     aspirin EC 81 MG tablet  Take 81 mg by mouth daily.     atorvastatin 20 MG tablet  Commonly known as:  LIPITOR  Take 20 mg by mouth daily.     Cholecalciferol 1000 UNITS capsule  Take 2,000 Units by mouth daily.     cyanocobalamin 2000 MCG tablet  Take 2,000 mcg  by mouth daily.     donepezil 5 MG tablet  Commonly known as:  ARICEPT  Take 5 mg by mouth daily at 6 PM.     fluticasone 50 MCG/ACT nasal spray  Commonly known as:  FLONASE  Place 1 spray into both nostrils 2 (two) times daily.     levothyroxine 25 MCG tablet  Commonly known as:  SYNTHROID, LEVOTHROID  Take 25 mcg by mouth daily before breakfast.     metoprolol 50 MG tablet  Commonly known as:  LOPRESSOR  Take 1 tablet (50 mg total) by mouth 2 (two) times daily.     multivitamin-lutein Caps capsule  Take 1 capsule by mouth daily.     omeprazole 40 MG capsule  Commonly known as:  PRILOSEC  Take 40 mg by mouth daily.     OVER THE COUNTER MEDICATION  Eye  wash, uses as needed for dry eyes     ranitidine 150 MG tablet  Commonly known as:  ZANTAC  Take 150 mg by mouth daily.     ULTRAM 50 MG tablet  Generic drug:  traMADol  Take 50 mg by mouth daily as needed.     warfarin 5 MG tablet  Commonly known as:  COUMADIN  Take 5 mg by mouth daily at 6 PM.       No Known Allergies Follow-up Information    Follow up with Olivia Mackie, MD. Schedule an appointment as soon as possible for a visit in 1 week.   Specialty:  Internal Medicine   Why:  Hospital follow up   Contact information:   [address] [place] [address] [phone]       [facility] with [facility].   Specialty:  Home Health Services   Why:  HHRN/PT/OT/aide/Social worker   Contact information:   [address] [place] [address] [phone]        The results of significant diagnostics from this hospitalization (including imaging, microbiology, ancillary and laboratory) are listed below for reference.    Significant Diagnostic Studies: Ct Abdomen Pelvis Wo Contrast  11/10/2014  CLINICAL DATA:  Upper abdominal pain x 3 days with N/V; oral contrast only - GFR 26 EXAM: CT ABDOMEN AND PELVIS WITHOUT CONTRAST TECHNIQUE: Multidetector CT imaging of the abdomen and pelvis was performed following the standard protocol without IV contrast. COMPARISON:  06/08/2012 FINDINGS: Lower chest: There are fibrotic changes at the lung bases bilaterally. Small calcified nodule is identified at the right lung base consistent with granuloma. The heart is enlarged. There is extensive coronary artery calcification. No pericardial effusion. Upper abdomen: No focal abnormality identified within the liver, spleen, pancreas, or adrenal glands. Low-attenuation lesions are identified within the kidneys consistent with cysts. A small hyperdense lesion is identified along the anterior midpole of the right kidney, consistent with cyst based on stability and previous  contrast-enhanced exams. Gallbladder is present. Layering high attenuation stones or sludge identified within the gallbladder. Gastrointestinal tract: The stomach has a normal appearance but is distended with contrast. There is dilatation of proximal and mid small bowel loops which measure up to 4.6 cm in the left mid abdomen. Point of obstruction appears to the left mid to lower abdomen. There is the appearance of a twist within the mesentery. Findings favor internal hernia or adhesion as a cause for the obstruction. Contrast does not reach the nondilated distal small bowel loops. There is a small amount of fluid within the small bowel mesenteric, surgically  within the left mid abdomen. There are numerous colonic diverticula but no CT evidence for acute diverticulitis. The appendix is surgically absent. Pelvis: Urinary bladder, prostate gland, and seminal vesicles have a normal appearance. Retroperitoneum: There is dense atherosclerotic calcification of the abdominal aorta. No aneurysm. No retroperitoneal or mesenteric adenopathy. Abdominal wall: Unremarkable. Osseous structures: Moderate to significant degenerative changes are identified within the entire lumbar spine. Vacuum disc phenomenon identified at numerous levels. No suspicious lytic or blastic lesions are identified. IMPRESSION: 1. Small bowel obstruction secondary to adhesions or internal hernia best localized to the mid small bowel in the left lower quadrant. No mass identified is source of obstruction. 2. Significant colonic diverticulosis. 3. Fibrotic changes at the lung bases. 4. Cardiomegaly and coronary artery disease. 5. Bilateral renal cysts. 6. Layering stones or sludge within the gallbladder. 7. Significant degenerative change within the spine. Electronically Signed   By: Norva PavlovElizabeth  Brown M.D.   On: 11/10/2014 14:29   Dg Abd Portable 1v  11/11/2014  CLINICAL DATA:  SBO protocol, 8 hour film following contrast, prior appendectomy EXAM:  PORTABLE ABDOMEN - 1 VIEW COMPARISON:  11/11/2014 at 0815 hours FINDINGS: Enteric tube terminates in the gastric cardia. Dilated loops of small bowel in the central abdomen. Contrast opacifies the ascending colon to the distal transverse colon. Degenerative changes of the lumbar spine. IMPRESSION: Enteric tube terminates in the gastric cardia. Dilated loops of small bowel in the central abdomen, unchanged. Contrast opacifies the ascending colon to the distal transverse colon at 8 hours. Electronically Signed   By: Charline BillsSriyesh  Krishnan M.D.   On: 11/11/2014 19:24   Dg Abd Portable 1v-small Bowel Obstruction Protocol-initial, 8 Hr Delay  11/11/2014  CLINICAL DATA:  81 year old male -NG tube placement. EXAM: PORTABLE ABDOMEN - 1 VIEW COMPARISON:  None. FINDINGS: An NG tube is identified with tip overlying the proximal stomach. Distended small bowel loops are again noted. No other significant changes identified. IMPRESSION: NG tube with tip overlying the proximal stomach- recommend advancement. Electronically Signed   By: Harmon PierJeffrey  Hu M.D.   On: 11/11/2014 08:33    Microbiology: No results found for this or any previous visit (from the past 240 hour(s)).   Labs: Basic Metabolic Panel:  Recent Labs Lab 11/10/14 1220 11/10/14 2058 11/11/14 0528 11/12/14 0625 11/13/14 0601 11/14/14 0437  NA 140  --  141 143 143 140  K 4.1  --  3.9 3.7 3.6 3.8  CL 103  --  106 109 110 109  CO2 25  --  26 23 27 22   GLUCOSE 151*  --  142* 149* 125* 150*  BUN 41*  --  47* 42* 38* 33*  CREATININE 2.02* 1.86* 1.71* 1.38* 1.11 1.03  CALCIUM 9.0  --  8.8* 8.7* 8.2* 8.4*   Liver Function Tests:  Recent Labs Lab 11/10/14 1220 11/11/14 0528  AST 28 21  ALT 18 16*  ALKPHOS 64 56  BILITOT 2.3* 2.9*  PROT 6.7 6.1*  ALBUMIN 3.7 3.5    Recent Labs Lab 11/10/14 1220  LIPASE 26   No results for input(s): AMMONIA in the last 168 hours. CBC:  Recent Labs Lab 11/10/14 1220 11/10/14 2058 11/11/14 0528  11/12/14 0625  WBC 15.3* 11.7* 11.3* 6.6  NEUTROABS 13.4*  --   --   --   HGB 14.9 14.1 13.8 14.3  HCT 43.7 41.8 41.0 41.4  MCV 91.6 92.3 93.4 92.0  PLT 212 199 206 208   Cardiac Enzymes: No results for input(s): CKTOTAL, CKMB,  CKMBINDEX, TROPONINI in the last 168 hours. BNP: BNP (last 3 results) No results for input(s): BNP in the last 8760 hours.  ProBNP (last 3 results) No results for input(s): PROBNP in the last 8760 hours.  CBG:  Recent Labs Lab 11/11/14 0759 11/11/14 1242 11/11/14 1739 11/12/14 0835 11/12/14 1144  GLUCAP 148* 134* 140* 131* 117*    Signed:  Jazel Nimmons K  [facility] 11/14/2014, 3:48 PM

## 2014-11-14 NOTE — Progress Notes (Signed)
  Subjective:  Alert.  Deconditioned.  Denies pain or nausea.  Doesn't have a very good appetite by report.  Eating some. Had 3 loose to soft stools. Feels good and asking if he can go home. Potassium 3.8.  Creatinine 1.03.  Glucose 150. Objective: Vital signs in last 24 hours: Temp:  [97.1 F (36.2 C)-97.9 F (36.6 C)] 97.2 F (36.2 C) (10/31 0550) Pulse Rate:  [87-98] 87 (10/31 0550) Resp:  [20] 20 (10/31 0550) BP: (116-129)/(64-93) 120/67 mmHg (10/31 0550) SpO2:  [99 %] 99 % (10/31 0550) Weight:  [69.9 kg (154 lb 1.6 oz)] 69.9 kg (154 lb 1.6 oz) (10/31 0550) Last BM Date: 11/12/14  Intake/Output from previous day: 10/30 0701 - 10/31 0700 In: 2265.8 [P.O.:920; I.V.:1195.8; IV Piggyback:150] Out: -  Intake/Output this shift:    General appearance: Elderly but alert and cooperative.  No distress.  Answers questions appropriately. Resp: clear to auscultation bilaterally GI: Soft and nontender.  A little distended and a little tympanitic.  Normoactive bowel sounds.  No abdominal or inguinal hernias.  Lab Results:   Recent Labs  11/12/14 0625  WBC 6.6  HGB 14.3  HCT 41.4  PLT 208   BMET  Recent Labs  11/13/14 0601 11/14/14 0437  NA 143 140  K 3.6 3.8  CL 110 109  CO2 27 22  GLUCOSE 125* 150*  BUN 38* 33*  CREATININE 1.11 1.03  CALCIUM 8.2* 8.4*   PT/INR  Recent Labs  11/13/14 0601  LABPROT 32.0*  INR 3.18*   ABG No results for input(s): PHART, HCO3 in the last 72 hours.  Invalid input(s): PCO2, PO2  Studies/Results: No results found.  Anti-infectives: Anti-infectives    None      Assessment/Plan:  SBO versus motility disorder.  Resolving Diet as tolerated Treat and discharge based on clinical condition.  Could go home today if tolerates diet No further imaging studies unless symptoms recur We will follow from a distance from here on out. Please call if surgical issues arise.  Hypertension atrial fibrillation Acute renal failure,  resolving with hydration Dementia, mild, stable. Hyperlipidemia GERD    LOS: 4 days    Edward Fitzpatrick M 11/14/2014

## 2014-11-14 NOTE — Care Management Note (Signed)
Case Management Note  Patient Details  Name: Thurnell LoseGeorge Creedon MRN: [id number] Date of Birth: [birth year]  Subjective/Objective:  Sp[facility] to [place] rep-Lisa faxed w/confirmation HHC orders, & face to face-fax#1 [phone].                  Action/Plan:d/c plan return Indep Liv w/HHC.   Expected Discharge Date:                  Expected Discharge Plan:  Home w Home Health Services  In-House Referral:     Discharge planning Services  CM Consult  Post Acute Care Choice:    Choice offered to:     DME Arranged:    DME Agency:     HH Arranged:  RN, PT, OT, Nurse's Aide, Social Work [employer] [facility]  Status of Service:  Completed, signed off  Medicare Important Message Given:  Yes-second notification given Date Medicare IM Given:    Medicare IM give by:    Date Additional Medicare IM Given:    Additional Medicare Important Message give by:     If discussed at Long Length of Stay Meetings, dates discussed:    Additional Comments:  Lanier ClamMahabir, Chondra Boyde, RN 11/14/2014, 1:36 PM

## 2014-11-16 ENCOUNTER — Emergency Department (HOSPITAL_COMMUNITY): Payer: Medicare HMO

## 2014-11-16 ENCOUNTER — Encounter (HOSPITAL_COMMUNITY): Payer: Self-pay | Admitting: Emergency Medicine

## 2014-11-16 ENCOUNTER — Inpatient Hospital Stay (HOSPITAL_COMMUNITY)
Admission: EM | Admit: 2014-11-16 | Discharge: 2014-11-20 | DRG: 389 | Disposition: A | Discharge status: Nursing Home (Includes ICF) | Payer: Medicare HMO | Attending: Internal Medicine | Admitting: Internal Medicine

## 2014-11-16 DIAGNOSIS — R5381 Other malaise: Secondary | ICD-10-CM | POA: Diagnosis present

## 2014-11-16 DIAGNOSIS — Z79899 Other long term (current) drug therapy: Secondary | ICD-10-CM

## 2014-11-16 DIAGNOSIS — I4891 Unspecified atrial fibrillation: Secondary | ICD-10-CM

## 2014-11-16 DIAGNOSIS — Z7982 Long term (current) use of aspirin: Secondary | ICD-10-CM

## 2014-11-16 DIAGNOSIS — K5669 Other intestinal obstruction: Secondary | ICD-10-CM | POA: Diagnosis not present

## 2014-11-16 DIAGNOSIS — R197 Diarrhea, unspecified: Secondary | ICD-10-CM | POA: Diagnosis present

## 2014-11-16 DIAGNOSIS — E860 Dehydration: Secondary | ICD-10-CM | POA: Diagnosis present

## 2014-11-16 DIAGNOSIS — I2510 Atherosclerotic heart disease of native coronary artery without angina pectoris: Secondary | ICD-10-CM | POA: Diagnosis present

## 2014-11-16 DIAGNOSIS — K5660 Unspecified intestinal obstruction: Secondary | ICD-10-CM | POA: Diagnosis present

## 2014-11-16 DIAGNOSIS — K56609 Unspecified intestinal obstruction, unspecified as to partial versus complete obstruction: Secondary | ICD-10-CM

## 2014-11-16 DIAGNOSIS — N179 Acute kidney failure, unspecified: Secondary | ICD-10-CM | POA: Diagnosis present

## 2014-11-16 DIAGNOSIS — Z951 Presence of aortocoronary bypass graft: Secondary | ICD-10-CM

## 2014-11-16 DIAGNOSIS — H3530 Unspecified macular degeneration: Secondary | ICD-10-CM | POA: Diagnosis present

## 2014-11-16 DIAGNOSIS — Z7901 Long term (current) use of anticoagulants: Secondary | ICD-10-CM

## 2014-11-16 DIAGNOSIS — Z66 Do not resuscitate: Secondary | ICD-10-CM | POA: Diagnosis present

## 2014-11-16 DIAGNOSIS — Z9049 Acquired absence of other specified parts of digestive tract: Secondary | ICD-10-CM | POA: Diagnosis not present

## 2014-11-16 DIAGNOSIS — Z79891 Long term (current) use of opiate analgesic: Secondary | ICD-10-CM | POA: Diagnosis not present

## 2014-11-16 DIAGNOSIS — E039 Hypothyroidism, unspecified: Secondary | ICD-10-CM | POA: Diagnosis present

## 2014-11-16 DIAGNOSIS — E876 Hypokalemia: Secondary | ICD-10-CM | POA: Diagnosis not present

## 2014-11-16 DIAGNOSIS — R748 Abnormal levels of other serum enzymes: Secondary | ICD-10-CM | POA: Diagnosis not present

## 2014-11-16 DIAGNOSIS — Z87891 Personal history of nicotine dependence: Secondary | ICD-10-CM

## 2014-11-16 DIAGNOSIS — F0390 Unspecified dementia without behavioral disturbance: Secondary | ICD-10-CM | POA: Diagnosis present

## 2014-11-16 DIAGNOSIS — I10 Essential (primary) hypertension: Secondary | ICD-10-CM | POA: Diagnosis not present

## 2014-11-16 HISTORY — DX: Unspecified intestinal obstruction, unspecified as to partial versus complete obstruction: K56.609

## 2014-11-16 LAB — CBC
HCT: 38 % — ABNORMAL LOW (ref 39.0–52.0)
HEMOGLOBIN: 13 g/dL (ref 13.0–17.0)
MCH: 31.2 pg (ref 26.0–34.0)
MCHC: 34.2 g/dL (ref 30.0–36.0)
MCV: 91.1 fL (ref 78.0–100.0)
Platelets: 201 10*3/uL (ref 150–400)
RBC: 4.17 MIL/uL — ABNORMAL LOW (ref 4.22–5.81)
RDW: 14.3 % (ref 11.5–15.5)
WBC: 11 10*3/uL — ABNORMAL HIGH (ref 4.0–10.5)

## 2014-11-16 LAB — COMPREHENSIVE METABOLIC PANEL
ALBUMIN: 2.6 g/dL — AB (ref 3.5–5.0)
ALK PHOS: 51 U/L (ref 38–126)
ALT: 24 U/L (ref 17–63)
ANION GAP: 7 (ref 5–15)
AST: 28 U/L (ref 15–41)
BILIRUBIN TOTAL: 1.3 mg/dL — AB (ref 0.3–1.2)
BUN: 47 mg/dL — AB (ref 6–20)
CALCIUM: 8.1 mg/dL — AB (ref 8.9–10.3)
CO2: 18 mmol/L — AB (ref 22–32)
CREATININE: 1.4 mg/dL — AB (ref 0.61–1.24)
Chloride: 115 mmol/L — ABNORMAL HIGH (ref 101–111)
GFR calc Af Amer: 48 mL/min — ABNORMAL LOW (ref >60)
GFR calc non Af Amer: 41 mL/min — ABNORMAL LOW (ref >60)
GLUCOSE: 99 mg/dL (ref 65–99)
Potassium: 3.1 mmol/L — ABNORMAL LOW (ref 3.5–5.1)
SODIUM: 140 mmol/L (ref 135–145)
TOTAL PROTEIN: 5.4 g/dL — AB (ref 6.5–8.1)

## 2014-11-16 LAB — URINE MICROSCOPIC-ADD ON

## 2014-11-16 LAB — TYPE AND SCREEN
ABO/RH(D): A POS
ANTIBODY SCREEN: NEGATIVE

## 2014-11-16 LAB — URINALYSIS, ROUTINE W REFLEX MICROSCOPIC
BILIRUBIN URINE: NEGATIVE
GLUCOSE, UA: NEGATIVE mg/dL
Ketones, ur: NEGATIVE mg/dL
Leukocytes, UA: NEGATIVE
Nitrite: NEGATIVE
PROTEIN: NEGATIVE mg/dL
Specific Gravity, Urine: 1.023 (ref 1.005–1.030)
Urobilinogen, UA: 0.2 mg/dL (ref 0.0–1.0)
pH: 5.5 (ref 5.0–8.0)

## 2014-11-16 LAB — LACTIC ACID, PLASMA: Lactic Acid, Venous: 1 mmol/L (ref 0.5–2.0)

## 2014-11-16 LAB — CBG MONITORING, ED: Glucose-Capillary: 96 mg/dL (ref 65–99)

## 2014-11-16 LAB — PROTIME-INR
INR: 3.73 — ABNORMAL HIGH (ref 0.00–1.49)
Prothrombin Time: 36 s — ABNORMAL HIGH (ref 11.6–15.2)

## 2014-11-16 LAB — LIPASE, BLOOD: Lipase: 62 U/L — ABNORMAL HIGH (ref 11–51)

## 2014-11-16 LAB — C DIFFICILE QUICK SCREEN W PCR REFLEX
C DIFFICILE (CDIFF) INTERP: NEGATIVE
C DIFFICLE (CDIFF) ANTIGEN: NEGATIVE
C Diff toxin: NEGATIVE

## 2014-11-16 MED ORDER — POTASSIUM CHLORIDE IN NACL 20-0.9 MEQ/L-% IV SOLN
INTRAVENOUS | Status: DC
  Administered 2014-11-16: 17:00:00 via INTRAVENOUS
  Filled 2014-11-16 (×2): qty 1000

## 2014-11-16 MED ORDER — SODIUM CHLORIDE 0.9 % IV SOLN
Freq: Once | INTRAVENOUS | Status: AC
  Administered 2014-11-16: 17:00:00 via INTRAVENOUS

## 2014-11-16 MED ORDER — SODIUM CHLORIDE 0.9 % IV SOLN: INTRAVENOUS | Status: DC

## 2014-11-16 MED ORDER — VITAMIN K1 10 MG/ML IJ SOLN
5.0000 mg | Freq: Once | INTRAVENOUS | Status: AC
  Administered 2014-11-16: 5 mg via INTRAVENOUS
  Filled 2014-11-16: qty 0.5

## 2014-11-16 MED ORDER — POTASSIUM CHLORIDE 10 MEQ/100ML IV SOLN
10.0000 meq | INTRAVENOUS | Status: AC
  Administered 2014-11-16 (×3): 10 meq via INTRAVENOUS
  Filled 2014-11-16 (×3): qty 100

## 2014-11-16 MED ORDER — METOPROLOL TARTRATE 1 MG/ML IV SOLN
2.5000 mg | Freq: Three times a day (TID) | INTRAVENOUS | Status: DC
  Administered 2014-11-16 – 2014-11-18 (×6): 2.5 mg via INTRAVENOUS
  Filled 2014-11-16 (×7): qty 5

## 2014-11-16 MED ORDER — SODIUM CHLORIDE 0.9 % IV BOLUS (SEPSIS)
250.0000 mL | Freq: Once | INTRAVENOUS | Status: AC
  Administered 2014-11-16: 250 mL via INTRAVENOUS

## 2014-11-16 MED ORDER — LEVOTHYROXINE SODIUM 100 MCG IV SOLR
12.5000 ug | Freq: Every day | INTRAVENOUS | Status: DC
Start: 2014-11-17 — End: 2014-11-18
  Administered 2014-11-17 – 2014-11-18 (×2): 12.5 ug via INTRAVENOUS
  Filled 2014-11-16 (×2): qty 5

## 2014-11-16 NOTE — Progress Notes (Signed)
Patient ID: Edward Fitzpatrick, male   DOB: [birth year], 81 y.o.   MRN: [id number]     [facility]      [address] [place], [place] [address]    Phone: [phone] FAX: [phone]     Subjective: Edward Fitzpatrick is a 81 year old male with a history of AFib on coumadin, hypothyroidism, HTN and dementia who was admitted 10/27-10/31 with a small bowel obstruction which resolved with conservative management.  He returns to the ED with abdominal pain and diarrhea.  The patient states he has been having diarrhea since he left the hospital.  It is loose/watery.  He has been not eating anything for 4 days, but drinking water, gatorade and ginger ale.  Denies nausea or vomiting.  Asking if he can have ice cream.  His work up shows K 3.1, sCr increased to 1.4, WBC 11k, INR 3.73.  Abdominal AXR shows marked small bowel loops concerning for a worsening bowel obstruction.  We have therefore been asked to re-evaluate.   Objective:  Vital signs:  Filed Vitals:   11/16/14 1332 11/16/14 1335 11/16/14 1343  BP: 107/75    Pulse: 61  132  Temp: 97.5 F (36.4 C)    TempSrc: Oral    Resp: 20    SpO2: 100% 100%        Intake/Output   Yesterday:    This shift:        Physical Exam: General: Pt awake/alert/oriented x4 in no acute distress Chest: bilateral wheezing.  No chest wall pain w good excursion CV:  S1S2 irregularly irregular, tachy.  MS: Normal AROM mjr joints.  No obvious deformity Abdomen: +BS, abdomen is soft, distended to upper abdomen, non distended to lower abdomen.   No evidence of peritonitis.  No incarcerated hernias. Ext:  SCDs BLE.  No mjr edema.  No cyanosis Skin: No petechiae / purpura   Problem List:   Active Problems:   SBO (small bowel obstruction) (Circle)    Results:   Labs: Results for orders placed or performed during the hospital encounter of 11/16/14 (from the past 48 hour(s))  CBG monitoring, ED     Status: None    Collection Time: 11/16/14  1:39 PM  Result Value Ref Range   Glucose-Capillary 96 65 - 99 mg/dL  Lipase, blood     Status: Abnormal   Collection Time: 11/16/14  1:42 PM  Result Value Ref Range   Lipase 62 (H) 11 - 51 U/L    Comment: Please note change in reference range.  Comprehensive metabolic panel     Status: Abnormal   Collection Time: 11/16/14  1:42 PM  Result Value Ref Range   Sodium 140 135 - 145 mmol/L   Potassium 3.1 (L) 3.5 - 5.1 mmol/L   Chloride 115 (H) 101 - 111 mmol/L   CO2 18 (L) 22 - 32 mmol/L   Glucose, Bld 99 65 - 99 mg/dL   BUN 47 (H) 6 - 20 mg/dL   Creatinine, Ser 1.40 (H) 0.61 - 1.24 mg/dL   Calcium 8.1 (L) 8.9 - 10.3 mg/dL   Total Protein 5.4 (L) 6.5 - 8.1 g/dL   Albumin 2.6 (L) 3.5 - 5.0 g/dL   AST 28 15 - 41 U/L   ALT 24 17 - 63 U/L   Alkaline Phosphatase 51 38 - 126 U/L   Total Bilirubin 1.3 (H) 0.3 - 1.2 mg/dL   GFR calc non Af Amer 41 (L) >60  mL/min   GFR calc Af Amer 48 (L) >60 mL/min    Comment: (NOTE) The eGFR has been calculated using the CKD EPI equation. This calculation has not been validated in all clinical situations. eGFR's persistently <60 mL/min signify possible Chronic Kidney Disease.    Anion gap 7 5 - 15  CBC     Status: Abnormal   Collection Time: 11/16/14  1:42 PM  Result Value Ref Range   WBC 11.0 (H) 4.0 - 10.5 K/uL   RBC 4.17 (L) 4.22 - 5.81 MIL/uL   Hemoglobin 13.0 13.0 - 17.0 g/dL   HCT 38.0 (L) 39.0 - 52.0 %   MCV 91.1 78.0 - 100.0 fL   MCH 31.2 26.0 - 34.0 pg   MCHC 34.2 30.0 - 36.0 g/dL   RDW 14.3 11.5 - 15.5 %   Platelets 201 150 - 400 K/uL  Protime-INR     Status: Abnormal   Collection Time: 11/16/14  1:47 PM  Result Value Ref Range   Prothrombin Time 36.0 (H) 11.6 - 15.2 seconds   INR 3.73 (H) 0.00 - 1.49    Imaging / Studies: Dg Abd 1 View  11/16/2014  CLINICAL DATA:  Reason small-bowel obstruction. Diarrhea and nausea. EXAM: ABDOMEN - 1 VIEW COMPARISON:  11/11/2014 abdominal radiograph FINDINGS:  Markedly distended small bowel loops throughout the mid and upper abdomen up to 6.6 cm diameter, with interval worsening. There is a relative paucity of gas in the colon. No evidence of pneumatosis or pneumoperitoneum. Vascular calcifications are seen throughout the soft tissues. Curvilinear opacity is seen at the lateral left lung base. Visualized median sternotomy wires appear aligned and intact. Degenerative changes throughout visualized thoracolumbar spine. IMPRESSION: Markedly distended small bowel loops throughout the mid and upper abdomen with interval worsening. Relative paucity of colonic gas. Findings suggest a worsening mid to distal small bowel obstruction. Correlate with CT abdomen/ pelvis as clinically warranted. Electronically Signed   By: Ilona Sorrel M.D.   On: 11/16/2014 15:01    Medications / Allergies:  Scheduled Meds: Continuous Infusions: PRN Meds:.  Antibiotics: Anti-infectives    None        Assessment/Plan SBO-he is having diarrhea and therefore he is not completely obstructed.  Furthermore, he does not have nausea or vomiting.  Will hold off on placing NGT due to INR of 3.7.  Give $Rem'5mg'BiGM$  vitamin K.  INR in AM.  Obtain a lactic acid.   Would hold his Coumadin for now.   Will discuss necessity for repeat CT scan or re-initiating SBO protocol with Dr. Dalbert Batman.  Would leave him NPO for now, supplement potassium for keep ~4, rehydrate and pain control. I discussed surgical options being a laparotomy should he fail medical management.  The patient does not wish to pursue surgery should he fail to improve.  We will follow along to guide non operative management.  Recommended medical admission.   AKI AFfib with RVR Chronic anticoagulation Hypothyroidism Hx appendectomy  Hx CAD/CABG, HTN  Edward Fitzpatrick, ANP-BC [facility] Pager [phone](7A-4:30P)   11/16/2014 3:48 PM

## 2014-11-16 NOTE — Progress Notes (Signed)
Utilization Review completed.  Ric Rosenberg RN CM  

## 2014-11-16 NOTE — ED Notes (Addendum)
Per EMS, Edward Fitzpatrick.  Has had diarrhea  For 3 days.   Patient is alert and orientated.  BP: 115/77 sitting  P: 140  Patient has a history of A-FIB 97% on room air  CBG: 118 EMS administered 450 N/S

## 2014-11-16 NOTE — ED Notes (Signed)
Bed: [id number] Expected date:  Expected time:  Means of arrival:  Comments: For room 11, ems- diarrheax5 days, recent bowel obstruction

## 2014-11-16 NOTE — H&P (Signed)
PCP:   Bill Salinas I, NP   Chief Complaint:  Diarrhea  HPI: 81 year old male who   has a past medical history of Atrial fibrillation (HCC); S/P CABG x 1; DVT (deep venous thrombosis) (HCC); Hypertension; Hypothyroid; and Macular degeneration. Today presents to the hospital with worsening abdominal distention and diarrhea. Patient was discharged from the hospital on 11/14/2014 after he was treated for small bowel obstruction. At that time he required NG tube, patient improved so he was discharged home. As per patient he has not been able to eat food since the discharge. Also has been having bowel movements with 4-5 loose stools everyday.  He denies nausea and vomiting. Patient says he was able to drink liquids. He denies chest pain, no shortness of breath. No fever. In the ED abdominal x-ray was done which showed mildly distended small bowel loops throughout the mid and upper abdomen with interval worsening. Findings suggesting worsening mid to distal small bowel obstruction. General surgery has been consulted by the ED physician.  Allergies:  No Known Allergies    Past Medical History  Diagnosis Date  . Atrial fibrillation (HCC)   . S/P CABG x 1   . DVT (deep venous thrombosis) (HCC)   . Hypertension   . Hypothyroid   . Macular degeneration     Past Surgical History  Procedure Laterality Date  . Coronary artery bypass graft    . Appendectomy      Prior to Admission medications   Medication Sig Start Date End Date Taking? Authorizing Provider  acetaminophen (TYLENOL) 500 MG tablet Take 1,000 mg by mouth every 6 (six) hours as needed for mild pain or headache.   Yes Historical Provider, MD  aspirin EC 81 MG tablet Take 81 mg by mouth daily.   Yes Historical Provider, MD  atorvastatin (LIPITOR) 20 MG tablet Take 20 mg by mouth daily.   Yes Historical Provider, MD  Cholecalciferol 1000 UNITS capsule Take 2,000 Units by mouth daily.  02/14/14  Yes Historical Provider, MD    cyanocobalamin 2000 MCG tablet Take 2,000 mcg by mouth daily.   Yes Historical Provider, MD  donepezil (ARICEPT) 5 MG tablet Take 5 mg by mouth daily at 6 PM. 05/02/14  Yes Historical Provider, MD  fluticasone (FLONASE) 50 MCG/ACT nasal spray Place 1 spray into both nostrils 2 (two) times daily. 10/11/14  Yes Historical Provider, MD  levothyroxine (SYNTHROID, LEVOTHROID) 25 MCG tablet Take 25 mcg by mouth daily before breakfast.   Yes Historical Provider, MD  losartan-hydrochlorothiazide (HYZAAR) 100-12.5 MG tablet Take 1 tablet by mouth daily.   Yes Historical Provider, MD  multivitamin-lutein (OCUVITE-LUTEIN) CAPS capsule Take 1 capsule by mouth daily.   Yes Historical Provider, MD  omeprazole (PRILOSEC) 40 MG capsule Take 40 mg by mouth daily.   Yes Historical Provider, MD  OVER THE COUNTER MEDICATION Eye wash, uses as needed for dry eyes   Yes Historical Provider, MD  ranitidine (ZANTAC) 150 MG tablet Take 150 mg by mouth daily. 10/08/14  Yes Historical Provider, MD  sodium chloride (OCEAN) 0.65 % SOLN nasal spray Place 1 spray into both nostrils as needed for congestion.   Yes Historical Provider, MD  traMADol (ULTRAM) 50 MG tablet Take 50 mg by mouth daily as needed for moderate pain.  11/03/14 11/03/15 Yes Historical Provider, MD  warfarin (COUMADIN) 5 MG tablet Take 5 mg by mouth daily at 6 PM. 05/09/14 05/09/15 Yes Historical Provider, MD  amLODipine (NORVASC) 2.5 MG tablet Take 1 tablet (  2.5 mg total) by mouth daily. 11/14/14   Jerald Kief, MD  metoprolol (LOPRESSOR) 50 MG tablet Take 1 tablet (50 mg total) by mouth 2 (two) times daily. 11/14/14   Jerald Kief, MD    Social History:  reports that he has quit smoking. He does not have any smokeless tobacco history on file. He reports that he drinks alcohol. He reports that he does not use illicit drugs.  All the positives are listed in BOLD  Review of Systems:  HEENT: Headache, blurred vision, runny nose, sore throat Neck:  Hypothyroidism, hyperthyroidism,,lymphadenopathy Chest : Shortness of breath, history of COPD, Asthma Heart : Chest pain, history of coronary arterey disease GI:  Nausea, vomiting, diarrhea, constipation, GERD GU: Dysuria, urgency, frequency of urination, hematuria Neuro: Stroke, seizures, syncope Psych: Depression, anxiety, hallucinations   Physical Exam: Blood pressure 107/65, pulse 119, temperature 97.5 F (36.4 C), temperature source Oral, resp. rate 20, SpO2 100 %. Constitutional:   Patient is a well-developed and well-nourished male* in no acute distress and cooperative with exam. Head: Normocephalic and atraumatic Mouth: Mucus membranes moist Eyes: PERRL, EOMI, conjunctivae normal Neck: Supple, No Thyromegaly Cardiovascular: RRR, S1 normal, S2 normal Pulmonary/Chest: CTAB, no wheezes, rales, or rhonchi Abdominal: Soft. Non-tender, distended ,bowel sounds are normal, no masses, organomegaly, or guarding present.  Neurological: A&O x3, Strength is normal and symmetric bilaterally, cranial nerve II-XII are grossly intact, no focal motor deficit, sensory intact to light touch bilaterally.  Extremities : No Cyanosis, Clubbing or Edema  Labs on Admission:  Basic Metabolic Panel:  Recent Labs Lab 11/11/14 0528 11/12/14 0625 11/13/14 0601 11/14/14 0437 11/16/14 1342  NA 141 143 143 140 140  K 3.9 3.7 3.6 3.8 3.1*  CL 106 109 110 109 115*  CO2 18*  GLUCOSE 142* 149* 125* 150* 99  BUN 47* 42* 38* 33* 47*  CREATININE 1.71* 1.38* 1.11 1.03 1.40*  CALCIUM 8.8* 8.7* 8.2* 8.4* 8.1*   Liver Function Tests:  Recent Labs Lab 11/10/14 1220 11/11/14 0528 11/16/14 1342  AST ALT 18 16* 24  ALKPHOS 64 56 51  BILITOT 2.3* 2.9* 1.3*  PROT 6.7 6.1* 5.4*  ALBUMIN 3.7 3.5 2.6*    Recent Labs Lab 11/10/14 1220 11/16/14 1342  LIPASE 26 62*   No results for input(s): AMMONIA in the last 168 hours. CBC:  Recent Labs Lab 11/10/14 1220 11/10/14 2058  11/11/14 0528 11/12/14 0625 11/16/14 1342  WBC 15.3* 11.7* 11.3* 6.6 11.0*  NEUTROABS 13.4*  --   --   --   --   HGB 14.9 14.1 13.8 14.3 13.0  HCT 43.7 41.8 41.0 41.4 38.0*  MCV 91.6 92.3 93.4 92.0 91.1  PLT 212 199 206 208 201   Cardiac Enzymes: No results for input(s): CKTOTAL, CKMB, CKMBINDEX, TROPONINI in the last 168 hours.  BNP (last 3 results) No results for input(s): BNP in the last 8760 hours.  ProBNP (last 3 results) No results for input(s): PROBNP in the last 8760 hours.  CBG:  Recent Labs Lab 11/11/14 1242 11/11/14 1739 11/12/14 0835 11/12/14 1144 11/16/14 1339  GLUCAP 134* 140* 131* 117* 96    Radiological Exams on Admission: Dg Abd 1 View  11/16/2014  CLINICAL DATA:  Reason small-bowel obstruction. Diarrhea and nausea. EXAM: ABDOMEN - 1 VIEW COMPARISON:  11/11/2014 abdominal radiograph FINDINGS: Markedly distended small bowel loops throughout the mid and upper abdomen up to 6.6 cm diameter, with interval worsening. There is a  relative paucity of gas in the colon. No evidence of pneumatosis or pneumoperitoneum. Vascular calcifications are seen throughout the soft tissues. Curvilinear opacity is seen at the lateral left lung base. Visualized median sternotomy wires appear aligned and intact. Degenerative changes throughout visualized thoracolumbar spine. IMPRESSION: Markedly distended small bowel loops throughout the mid and upper abdomen with interval worsening. Relative paucity of colonic gas. Findings suggest a worsening mid to distal small bowel obstruction. Correlate with CT abdomen/ pelvis as clinically warranted. Electronically Signed   By: Delbert PhenixJason A Poff M.D.   On: 11/16/2014 15:01    EKG: Independently reviewed. Atrial fibrillation   Assessment/Plan Active Problems:   Small bowel obstruction (HCC)   Atrial fibrillation (HCC)   Hypothyroidism   SBO (small bowel obstruction) (HCC)   Hypokalemia  Small bowel obstruction Patient's x-ray of the abdomen  shows small bowel obstruction. Gen. surgery has seen the patient and to command conservative management. No need of NG tube at this time as patient is not vomiting.  Hypokalemia Replace potassium will start IV KCl 10 meq x 3 . Follow BMP in a.m.  Atrial fibrillation Patient did not take his metoprolol. We'll start IV metoprolol 2.5 mg every 6 hours. INR is 3.73. We'll get pharmacy to manage the dosing  of Coumadin.  Hypothyroidism Patient takes Synthroid 25 g at home. Will start IV Synthroid 12.5 g daily.   Code status: DNR  Family discussion: No family at bedside   Time Spent on Admission:   [facility],Larrie Lucia S [facility] Pager: [phone] 11/16/2014, 4:00 PM  If 7PM-7AM, please contact night-coverage  [email or web address]  Password [email or web address]

## 2014-11-16 NOTE — ED Notes (Signed)
Pt aware of need for urine  

## 2014-11-17 ENCOUNTER — Inpatient Hospital Stay (HOSPITAL_COMMUNITY): Payer: Medicare HMO

## 2014-11-17 LAB — CBC
HEMATOCRIT: 33.5 % — AB (ref 39.0–52.0)
HEMOGLOBIN: 11.4 g/dL — AB (ref 13.0–17.0)
MCH: 31.3 pg (ref 26.0–34.0)
MCHC: 34 g/dL (ref 30.0–36.0)
MCV: 92 fL (ref 78.0–100.0)
Platelets: 187 10*3/uL (ref 150–400)
RBC: 3.64 MIL/uL — ABNORMAL LOW (ref 4.22–5.81)
RDW: 14.6 % (ref 11.5–15.5)
WBC: 8.6 10*3/uL (ref 4.0–10.5)

## 2014-11-17 LAB — COMPREHENSIVE METABOLIC PANEL
ALBUMIN: 2.5 g/dL — AB (ref 3.5–5.0)
ALK PHOS: 49 U/L (ref 38–126)
ALT: 23 U/L (ref 17–63)
ANION GAP: 6 (ref 5–15)
AST: 28 U/L (ref 15–41)
BILIRUBIN TOTAL: 1.4 mg/dL — AB (ref 0.3–1.2)
BUN: 37 mg/dL — ABNORMAL HIGH (ref 6–20)
CALCIUM: 7.6 mg/dL — AB (ref 8.9–10.3)
CO2: 22 mmol/L (ref 22–32)
Chloride: 112 mmol/L — ABNORMAL HIGH (ref 101–111)
Creatinine, Ser: 1.03 mg/dL (ref 0.61–1.24)
GFR, EST NON AFRICAN AMERICAN: 60 mL/min — AB (ref >60)
GLUCOSE: 82 mg/dL (ref 65–99)
POTASSIUM: 3.2 mmol/L — AB (ref 3.5–5.1)
Sodium: 140 mmol/L (ref 135–145)
TOTAL PROTEIN: 4.9 g/dL — AB (ref 6.5–8.1)

## 2014-11-17 LAB — PROTIME-INR
INR: 1.44 (ref 0.00–1.49)
PROTHROMBIN TIME: 17.6 s — AB (ref 11.6–15.2)

## 2014-11-17 LAB — GLUCOSE, CAPILLARY: Glucose-Capillary: 82 mg/dL (ref 65–99)

## 2014-11-17 LAB — ABO/RH: ABO/RH(D): A POS

## 2014-11-17 LAB — MAGNESIUM: Magnesium: 1.7 mg/dL (ref 1.7–2.4)

## 2014-11-17 MED ORDER — POTASSIUM CHLORIDE 10 MEQ/100ML IV SOLN: 10.0000 meq | INTRAVENOUS | Status: DC

## 2014-11-17 MED ORDER — MAGNESIUM SULFATE IN D5W 10-5 MG/ML-% IV SOLN
1.0000 g | Freq: Once | INTRAVENOUS | Status: AC
  Administered 2014-11-17: 1 g via INTRAVENOUS
  Filled 2014-11-17: qty 100

## 2014-11-17 MED ORDER — POTASSIUM CHLORIDE IN NACL 40-0.9 MEQ/L-% IV SOLN
INTRAVENOUS | Status: DC
  Administered 2014-11-17: 100 mL/h via INTRAVENOUS
  Administered 2014-11-18: 75 mL/h via INTRAVENOUS
  Administered 2014-11-18: 100 mL/h via INTRAVENOUS
  Administered 2014-11-19: 75 mL/h via INTRAVENOUS
  Filled 2014-11-17 (×5): qty 1000

## 2014-11-17 MED ORDER — POTASSIUM CHLORIDE 10 MEQ/100ML IV SOLN
10.0000 meq | INTRAVENOUS | Status: AC
  Administered 2014-11-17 (×6): 10 meq via INTRAVENOUS
  Filled 2014-11-17 (×5): qty 100

## 2014-11-17 NOTE — Progress Notes (Signed)
Patient ID: Edward Fitzpatrick, male   DOB: [birth year], 81 y.o.   MRN: [id number]     [address] [place], [place] [address]    Phone: [phone] FAX: [phone]     Subjective: No n/v.  No pain.  Having loose bms's.   Objective:  Vital signs:  Filed Vitals:   11/17/14 0042 11/17/14 0115 11/17/14 0215 11/17/14 0546  BP: 100/51 96/66 105/67 106/72  Pulse: 115 110 117 115  Temp: 97.4 F (36.3 C) 97.1 F (36.2 C) 97.4 F (36.3 C) 97.4 F (36.3 C)  TempSrc: Oral Oral Oral Oral  Resp: _0 Height:      Weight:      SpO2: 99% 100% 99% 98%    Last BM Date: 11/16/14  Intake/Output   Yesterday:  11/02 0701 - 11/03 0700 In: 1852.9 [P.O.:100; I.V.:1046.3; Blood:506.7; IV Piggyback:200] Out: -  This shift:          Physical Exam: General: Pt awake/alert/oriented x4 in no acute distress Abdomen: +BS, abdomen is soft, non distended and non tender. No evidence of peritonitis. No incarcerated hernias.     Problem List:   Active Problems:   Small bowel obstruction (HCC)   Atrial fibrillation (HCC)   Hypothyroidism   SBO (small bowel obstruction) (HCC)   Hypokalemia    Results:   Labs: Results for orders placed or performed during the hospital encounter of 11/16/14 (from the past 48 hour(s))  CBG monitoring, ED     Status: None   Collection Time: 11/16/14  1:39 PM  Result Value Ref Range   Glucose-Capillary 96 65 - 99 mg/dL  Lipase, blood     Status: Abnormal   Collection Time: 11/16/14  1:42 PM  Result Value Ref Range   Lipase 62 (H) 11 - 51 U/L    Comment: Please note change in reference range.  Comprehensive metabolic panel     Status: Abnormal   Collection Time: 11/16/14  1:42 PM  Result Value Ref Range   Sodium 140 135 - 145 mmol/L   Potassium 3.1 (L) 3.5 - 5.1 mmol/L   Chloride 115 (H) 101 - 111 mmol/L   CO2 18 (L) 22 - 32 mmol/L   Glucose, Bld 99 65 - 99 mg/dL   BUN 47 (H) 6 - 20 mg/dL   Creatinine, Ser 1.40 (H) 0.61 - 1.24 mg/dL   Calcium 8.1 (L) 8.9 - 10.3 mg/dL   Total Protein 5.4 (L) 6.5 - 8.1 g/dL   Albumin 2.6 (L) 3.5 - 5.0 g/dL   AST 28 15 - 41 U/L   ALT 24 17 - 63 U/L   Alkaline Phosphatase 51 38 - 126 U/L   Total Bilirubin 1.3 (H) 0.3 - 1.2 mg/dL   GFR calc non Af Amer 41 (L) >60 mL/min   GFR calc Af Amer 48 (L) >60 mL/min    Comment: (NOTE) The eGFR has been calculated using the CKD EPI equation. This calculation has not been validated in all clinical situations. eGFR's persistently <60 mL/min signify possible Chronic Kidney Disease.    Anion gap 7 5 - 15  CBC     Status: Abnormal   Collection Time: 11/16/14  1:42 PM  Result Value Ref Range   WBC 11.0 (H) 4.0 - 10.5 K/uL   RBC 4.17 (L) 4.22 - 5.81 MIL/uL   Hemoglobin 13.0 13.0 - 17.0 g/dL   HCT 38.0 (L)  39.0 - 52.0 %   MCV 91.1 78.0 - 100.0 fL   MCH 31.2 26.0 - 34.0 pg   MCHC 34.2 30.0 - 36.0 g/dL   RDW 14.3 11.5 - 15.5 %   Platelets 201 150 - 400 K/uL  Protime-INR     Status: Abnormal   Collection Time: 11/16/14  1:47 PM  Result Value Ref Range   Prothrombin Time 36.0 (H) 11.6 - 15.2 seconds   INR 3.73 (H) 0.00 - 1.49  Lactic acid, plasma     Status: None   Collection Time: 11/16/14  4:44 PM  Result Value Ref Range   Lactic Acid, Venous 1.0 0.5 - 2.0 mmol/L  Prepare fresh frozen plasma     Status: None (Preliminary result)   Collection Time: 11/16/14  5:30 PM  Result Value Ref Range   Unit Number [id number]    Blood Component Type THWPLS APHR1    Unit division 00    Status of Unit ISSUED,FINAL    Transfusion Status OK TO TRANSFUSE    Unit Number [id number]    Blood Component Type THAWED PLASMA    Unit division 00    Status of Unit ISSUED    Transfusion Status OK TO TRANSFUSE   Type and screen     Status: None   Collection Time: 11/16/14  5:30 PM  Result Value Ref Range   ABO/RH(D) A POS    Antibody Screen NEG    Sample Expiration 11/19/2014   ABO/Rh      Status: None   Collection Time: 11/16/14  5:30 PM  Result Value Ref Range   ABO/RH(D) A POS   C difficile quick scan w PCR reflex     Status: None   Collection Time: 11/16/14  6:00 PM  Result Value Ref Range   C Diff antigen NEGATIVE NEGATIVE   C Diff toxin NEGATIVE NEGATIVE   C Diff interpretation Negative for toxigenic C. difficile   Urinalysis, Routine w reflex microscopic (not at [facility])     Status: Abnormal   Collection Time: 11/16/14  6:09 PM  Result Value Ref Range   Color, Urine YELLOW YELLOW   APPearance CLEAR CLEAR   Specific Gravity, Urine 1.023 1.005 - 1.030   pH 5.5 5.0 - 8.0   Glucose, UA NEGATIVE NEGATIVE mg/dL   Hgb urine dipstick TRACE (A) NEGATIVE   Bilirubin Urine NEGATIVE NEGATIVE   Ketones, ur NEGATIVE NEGATIVE mg/dL   Protein, ur NEGATIVE NEGATIVE mg/dL   Urobilinogen, UA 0.2 0.0 - 1.0 mg/dL   Nitrite NEGATIVE NEGATIVE   Leukocytes, UA NEGATIVE NEGATIVE  Urine microscopic-add on     Status: Abnormal   Collection Time: 11/16/14  6:09 PM  Result Value Ref Range   Squamous Epithelial / LPF FEW (A) RARE   WBC, UA 0-2 <3 WBC/hpf   RBC / HPF 0-2 <3 RBC/hpf   Bacteria, UA FEW (A) RARE   Casts HYALINE CASTS (A) NEGATIVE  CBC     Status: Abnormal   Collection Time: 11/17/14  5:00 AM  Result Value Ref Range   WBC 8.6 4.0 - 10.5 K/uL   RBC 3.64 (L) 4.22 - 5.81 MIL/uL   Hemoglobin 11.4 (L) 13.0 - 17.0 g/dL   HCT 33.5 (L) 39.0 - 52.0 %   MCV 92.0 78.0 - 100.0 fL   MCH 31.3 26.0 - 34.0 pg   MCHC 34.0 30.0 - 36.0 g/dL   RDW 14.6 11.5 - 15.5 %   Platelets 187 150 - 400 K/uL  Comprehensive metabolic panel     Status: Abnormal   Collection Time: 11/17/14  5:00 AM  Result Value Ref Range   Sodium 140 135 - 145 mmol/L   Potassium 3.2 (L) 3.5 - 5.1 mmol/L   Chloride 112 (H) 101 - 111 mmol/L   CO2 22 22 - 32 mmol/L   Glucose, Bld 82 65 - 99 mg/dL   BUN 37 (H) 6 - 20 mg/dL   Creatinine, Ser 1.03 0.61 - 1.24 mg/dL   Calcium 7.6 (L) 8.9 - 10.3 mg/dL   Total  Protein 4.9 (L) 6.5 - 8.1 g/dL   Albumin 2.5 (L) 3.5 - 5.0 g/dL   AST 28 15 - 41 U/L   ALT 23 17 - 63 U/L   Alkaline Phosphatase 49 38 - 126 U/L   Total Bilirubin 1.4 (H) 0.3 - 1.2 mg/dL   GFR calc non Af Amer 60 (L) >60 mL/min   GFR calc Af Amer >60 >60 mL/min    Comment: (NOTE) The eGFR has been calculated using the CKD EPI equation. This calculation has not been validated in all clinical situations. eGFR's persistently <60 mL/min signify possible Chronic Kidney Disease.    Anion gap 6 5 - 15  Protime-INR     Status: Abnormal   Collection Time: 11/17/14  5:00 AM  Result Value Ref Range   Prothrombin Time 17.6 (H) 11.6 - 15.2 seconds   INR 1.44 0.00 - 1.49  Magnesium     Status: None   Collection Time: 11/17/14  5:00 AM  Result Value Ref Range   Magnesium 1.7 1.7 - 2.4 mg/dL    Imaging / Studies: Dg Abd 1 View  11/16/2014  CLINICAL DATA:  Reason small-bowel obstruction. Diarrhea and nausea. EXAM: ABDOMEN - 1 VIEW COMPARISON:  11/11/2014 abdominal radiograph FINDINGS: Markedly distended small bowel loops throughout the mid and upper abdomen up to 6.6 cm diameter, with interval worsening. There is a relative paucity of gas in the colon. No evidence of pneumatosis or pneumoperitoneum. Vascular calcifications are seen throughout the soft tissues. Curvilinear opacity is seen at the lateral left lung base. Visualized median sternotomy wires appear aligned and intact. Degenerative changes throughout visualized thoracolumbar spine. IMPRESSION: Markedly distended small bowel loops throughout the mid and upper abdomen with interval worsening. Relative paucity of colonic gas. Findings suggest a worsening mid to distal small bowel obstruction. Correlate with CT abdomen/ pelvis as clinically warranted. Electronically Signed   By: Ilona Sorrel M.D.   On: 11/16/2014 15:01    Medications / Allergies:  Scheduled Meds: . levothyroxine  12.5 mcg Intravenous QAC breakfast  . magnesium sulfate 1 - 4 g  bolus IVPB  1 g Intravenous Once  . metoprolol  2.5 mg Intravenous 3 times per day   Continuous Infusions: . 0.9 % NaCl with KCl 40 mEq / L 100 mL/hr (11/17/14 0826)   PRN Meds:.  Antibiotics: Anti-infectives    None        Assessment/Plan pSBO-abdomen is much softer today and non tender.  Will await AXR.  If unchanged, then will replace NGT. This morning, the patient adamantly refuses any surgical intervention.  It would be beneficial to establish goals of care for this patient. Leave NPO for now, replace K to keep at 4.  Hold coumadin in case NGT is warranted.   AKI-sCr 1.03 today.  AFfib with RVR-rate improved Chronic anticoagulation-hold coumadin for now Hypothyroidism Hx appendectomy  Hx CAD/CABG, HTN     , ANP-BC [facility]  Pager [phone](7A-4:30P)   11/17/2014 9:18 AM

## 2014-11-17 NOTE — Progress Notes (Addendum)
[facility] PROGRESS NOTE  Avyay Coger ZOX:[id number] DOB: [birth year] DOA: 11/16/2014 PCP: Bill Salinas I, NP  Length of stay: 1   Assessment/Plan: Active Problems:   Small bowel obstruction (HCC)   Atrial fibrillation (HCC)   Hypothyroidism   SBO (small bowel obstruction) (HCC)   Hypokalemia    Small bowel obstruction Patient's x-ray of the abdomen shows partial mid to distal small bowel obstruction with some improvement since yesterday. No evidence of perforation. Gen. surgery feels x-rays are lagging behind his clinical progress. Patient refusing surgery. Hopefully will resolve with conservative management, replete electrolytes, start clears   Diarrhea-resolved, C. difficile PCR negative  Hypokalemia Potassium 3.2, will replete aggressively, magnesium 1.7 Replete both  Atrial fibrillation Continue IV metoprolol 2.5 mg every 6 hours. INR is 1.44, status post receiving vitamin K, Okay to place NG tube with subtherapeutic INR if needed tonight   Hypothyroidism Patient takes Synthroid 25 g at home. Continue IV Synthroid 12.5 g daily.    DVT prophylaxsis SCDs  Code Status:      Code Status Orders        Start     Ordered   11/16/14 1643  Do not attempt resuscitation (DNR)   Continuous    Question Answer Comment  In the event of cardiac or respiratory ARREST Do not call a "code blue"   In the event of cardiac or respiratory ARREST Do not perform Intubation, CPR, defibrillation or ACLS   In the event of cardiac or respiratory ARREST Use medication by any route, position, wound care, and other measures to relive pain and suffering. May use oxygen, suction and manual treatment of airway obstruction as needed for comfort.      11/16/14 1642    Advance Directive Documentation        Most Recent Value   Type of Advance Directive  Out of facility DNR (pink MOST or yellow form)   Pre-existing out of facility DNR order (yellow form or pink MOST form)   Yellow form placed in chart (order not valid for inpatient use)   "MOST" Form in Place?       Family Communication: family updated about patient's clinical progress Disposition Plan:  As above    Brief narrative: 81 year old male who  has a past medical history of Atrial fibrillation (HCC); S/P CABG x 1; DVT (deep venous thrombosis) (HCC); Hypertension; Hypothyroid; and Macular degeneration. Today presents to the hospital with worsening abdominal distention and diarrhea. Patient was discharged from the hospital on 11/14/2014 after he was treated for small bowel obstruction. At that time he required NG tube, patient improved so he was discharged home. As per patient he has not been able to eat food since the discharge. Also has been having bowel movements with 4-5 loose stools everyday.  He denies nausea and vomiting. Patient says he was able to drink liquids. He denies chest pain, no shortness of breath. No fever. In the ED abdominal x-ray was done which showed mildly distended small bowel loops throughout the mid and upper abdomen with interval worsening. Findings suggesting worsening mid to distal small bowel obstruction. General surgery has been consulted    Consultants:  General surgery  Procedures:  None  Antibiotics: Anti-infectives    None         HPI/Subjective: Denies nausea vomiting abdominal pain, very hungry, patient states that he has not had a BM since admission  Objective: Filed Vitals:   11/17/14 8119 11/17/14 0115 11/17/14 0215  11/17/14 0546  BP: 100/51 96/66 105/67 106/72  Pulse: 115 110 117 115  Temp: 97.4 F (36.3 C) 97.1 F (36.2 C) 97.4 F (36.3 C) 97.4 F (36.3 C)  TempSrc: Oral Oral Oral Oral  Resp: 16 16 16 18   Height:      Weight:      SpO2: 99% 100% 99% 98%    Intake/Output Summary (Last 24 hours) at 11/17/14 1033 Last data filed at 11/17/14 1610  Gross per 24 hour  Intake 1852.92 ml  Output      0 ml  Net 1852.92 ml     Exam:  General: No acute respiratory distress Lungs: Clear to auscultation bilaterally without wheezes or crackles Cardiovascular: Regular rate and rhythm without murmur gallop or rub normal S1 and S2 Abdomen: Nontender, nondistended, soft, bowel sounds positive, no rebound, no ascites, no appreciable mass Extremities: No significant cyanosis, clubbing, or edema bilateral lower extremities     Data Review   Micro Results Recent Results (from the past 240 hour(s))  C difficile quick scan w PCR reflex     Status: None   Collection Time: 11/16/14  6:00 PM  Result Value Ref Range Status   C Diff antigen NEGATIVE NEGATIVE Final   C Diff toxin NEGATIVE NEGATIVE Final   C Diff interpretation Negative for toxigenic C. difficile  Final    Radiology Reports Ct Abdomen Pelvis Wo Contrast  11/10/2014  CLINICAL DATA:  Upper abdominal pain x 3 days with N/V; oral contrast only - GFR 26 EXAM: CT ABDOMEN AND PELVIS WITHOUT CONTRAST TECHNIQUE: Multidetector CT imaging of the abdomen and pelvis was performed following the standard protocol without IV contrast. COMPARISON:  06/08/2012 FINDINGS: Lower chest: There are fibrotic changes at the lung bases bilaterally. Small calcified nodule is identified at the right lung base consistent with granuloma. The heart is enlarged. There is extensive coronary artery calcification. No pericardial effusion. Upper abdomen: No focal abnormality identified within the liver, spleen, pancreas, or adrenal glands. Low-attenuation lesions are identified within the kidneys consistent with cysts. A small hyperdense lesion is identified along the anterior midpole of the right kidney, consistent with cyst based on stability and previous contrast-enhanced exams. Gallbladder is present. Layering high attenuation stones or sludge identified within the gallbladder. Gastrointestinal tract: The stomach has a normal appearance but is distended with contrast. There is dilatation of  proximal and mid small bowel loops which measure up to 4.6 cm in the left mid abdomen. Point of obstruction appears to the left mid to lower abdomen. There is the appearance of a twist within the mesentery. Findings favor internal hernia or adhesion as a cause for the obstruction. Contrast does not reach the nondilated distal small bowel loops. There is a small amount of fluid within the small bowel mesenteric, surgically within the left mid abdomen. There are numerous colonic diverticula but no CT evidence for acute diverticulitis. The appendix is surgically absent. Pelvis: Urinary bladder, prostate gland, and seminal vesicles have a normal appearance. Retroperitoneum: There is dense atherosclerotic calcification of the abdominal aorta. No aneurysm. No retroperitoneal or mesenteric adenopathy. Abdominal wall: Unremarkable. Osseous structures: Moderate to significant degenerative changes are identified within the entire lumbar spine. Vacuum disc phenomenon identified at numerous levels. No suspicious lytic or blastic lesions are identified. IMPRESSION: 1. Small bowel obstruction secondary to adhesions or internal hernia best localized to the mid small bowel in the left lower quadrant. No mass identified is source of obstruction. 2. Significant colonic diverticulosis. 3. Fibrotic changes  at the lung bases. 4. Cardiomegaly and coronary artery disease. 5. Bilateral renal cysts. 6. Layering stones or sludge within the gallbladder. 7. Significant degenerative change within the spine. Electronically Signed   By: Norva Pavlov M.D.   On: 11/10/2014 14:29   Dg Abd 1 View  11/16/2014  CLINICAL DATA:  Reason small-bowel obstruction. Diarrhea and nausea. EXAM: ABDOMEN - 1 VIEW COMPARISON:  11/11/2014 abdominal radiograph FINDINGS: Markedly distended small bowel loops throughout the mid and upper abdomen up to 6.6 cm diameter, with interval worsening. There is a relative paucity of gas in the colon. No evidence of  pneumatosis or pneumoperitoneum. Vascular calcifications are seen throughout the soft tissues. Curvilinear opacity is seen at the lateral left lung base. Visualized median sternotomy wires appear aligned and intact. Degenerative changes throughout visualized thoracolumbar spine. IMPRESSION: Markedly distended small bowel loops throughout the mid and upper abdomen with interval worsening. Relative paucity of colonic gas. Findings suggest a worsening mid to distal small bowel obstruction. Correlate with CT abdomen/ pelvis as clinically warranted. Electronically Signed   By: Delbert Phenix M.D.   On: 11/16/2014 15:01   Dg Abd 2 Views  11/17/2014  CLINICAL DATA:  Follow-up of small bowel obstruction, improving symptoms. EXAM: ABDOMEN - 2 VIEW COMPARISON:  Abdominal series of February 15, 2014 FINDINGS: There remain loops of mildly distended gas-filled small bowel in the midline. The degree of distention has slightly decreased. There is colonic gas and stool exhibiting a normal pattern. Somewhat more rectal gas is observed. IMPRESSION: Findings consistent with a partial mid to distal small bowel obstruction. Some improvement since yesterday's study has occurred. There is no evidence of perforation. Electronically Signed   By: David  [place] M.D.   On: 11/17/2014 09:32   Dg Abd Portable 1v  11/11/2014  CLINICAL DATA:  SBO protocol, 8 hour film following contrast, prior appendectomy EXAM: PORTABLE ABDOMEN - 1 VIEW COMPARISON:  11/11/2014 at 0815 hours FINDINGS: Enteric tube terminates in the gastric cardia. Dilated loops of small bowel in the central abdomen. Contrast opacifies the ascending colon to the distal transverse colon. Degenerative changes of the lumbar spine. IMPRESSION: Enteric tube terminates in the gastric cardia. Dilated loops of small bowel in the central abdomen, unchanged. Contrast opacifies the ascending colon to the distal transverse colon at 8 hours. Electronically Signed   By: Charline Bills  M.D.   On: 11/11/2014 19:24   Dg Abd Portable 1v-small Bowel Obstruction Protocol-initial, 8 Hr Delay  11/11/2014  CLINICAL DATA:  81 year old male -NG tube placement. EXAM: PORTABLE ABDOMEN - 1 VIEW COMPARISON:  None. FINDINGS: An NG tube is identified with tip overlying the proximal stomach. Distended small bowel loops are again noted. No other significant changes identified. IMPRESSION: NG tube with tip overlying the proximal stomach- recommend advancement. Electronically Signed   By: Harmon Pier M.D.   On: 11/11/2014 08:33     CBC  Recent Labs Lab 11/10/14 1220 11/10/14 2058 11/11/14 0528 11/12/14 0625 11/16/14 1342 11/17/14 0500  WBC 15.3* 11.7* 11.3* 6.6 11.0* 8.6  HGB 14.9 14.1 13.8 14.3 13.0 11.4*  HCT 43.7 41.8 41.0 41.4 38.0* 33.5*  PLT 212 199 206 208 201 187  MCV 91.6 92.3 93.4 92.0 91.1 92.0  MCH 31.2 31.1 31.4 31.8 31.2 31.3  MCHC 34.1 33.7 33.7 34.5 34.2 34.0  RDW 14.3 14.3 14.4 14.0 14.3 14.6  LYMPHSABS 0.6*  --   --   --   --   --   MONOABS 1.2*  --   --   --   --   --  EOSABS 0.0  --   --   --   --   --   BASOSABS 0.0  --   --   --   --   --     Chemistries   Recent Labs Lab 11/10/14 1220  11/11/14 0528 11/12/14 0625 11/13/14 0601 11/14/14 0437 11/16/14 1342 11/17/14 0500  NA 140  --  141 143 143 140 140 140  K 4.1  --  3.9 3.7 3.6 3.8 3.1* 3.2*  CL 103  --  106 109 110 109 115* 112*  CO2 25  --  26 23 27 22  18* 22  GLUCOSE 151*  --  142* 149* 125* 150* 99 82  BUN 41*  --  47* 42* 38* 33* 47* 37*  CREATININE 2.02*  < > 1.71* 1.38* 1.11 1.03 1.40* 1.03  CALCIUM 9.0  --  8.8* 8.7* 8.2* 8.4* 8.1* 7.6*  MG  --   --   --   --   --   --   --  1.7  AST 28  --  21  --   --   --  28 28  ALT 18  --  16*  --   --   --  24 23  ALKPHOS 64  --  56  --   --   --  51 49  BILITOT 2.3*  --  2.9*  --   --   --  1.3* 1.4*  < > = values in this interval not  displayed. ------------------------------------------------------------------------------------------------------------------ estimated creatinine clearance is 40.1 mL/min (by C-G formula based on Cr of 1.03). ------------------------------------------------------------------------------------------------------------------ No results for input(s): HGBA1C in the last 72 hours. ------------------------------------------------------------------------------------------------------------------ No results for input(s): CHOL, HDL, LDLCALC, TRIG, CHOLHDL, LDLDIRECT in the last 72 hours. ------------------------------------------------------------------------------------------------------------------ No results for input(s): TSH, T4TOTAL, T3FREE, THYROIDAB in the last 72 hours.  Invalid input(s): FREET3 ------------------------------------------------------------------------------------------------------------------ No results for input(s): VITAMINB12, FOLATE, FERRITIN, TIBC, IRON, RETICCTPCT in the last 72 hours.  Coagulation profile  Recent Labs Lab 11/11/14 0528 11/13/14 0601 11/14/14 0955 11/16/14 1347 11/17/14 0500  INR 3.50* 3.18* 2.07* 3.73* 1.44    No results for input(s): DDIMER in the last 72 hours.  Cardiac Enzymes No results for input(s): CKMB, TROPONINI, MYOGLOBIN in the last 168 hours.  Invalid input(s): CK ------------------------------------------------------------------------------------------------------------------ Invalid input(s): POCBNP   CBG:  Recent Labs Lab 11/11/14 1242 11/11/14 1739 11/12/14 0835 11/12/14 1144 11/16/14 1339  GLUCAP 134* 140* 131* 117* 96       Studies: Dg Abd 1 View  11/16/2014  CLINICAL DATA:  Reason small-bowel obstruction. Diarrhea and nausea. EXAM: ABDOMEN - 1 VIEW COMPARISON:  11/11/2014 abdominal radiograph FINDINGS: Markedly distended small bowel loops throughout the mid and upper abdomen up to 6.6 cm diameter, with  interval worsening. There is a relative paucity of gas in the colon. No evidence of pneumatosis or pneumoperitoneum. Vascular calcifications are seen throughout the soft tissues. Curvilinear opacity is seen at the lateral left lung base. Visualized median sternotomy wires appear aligned and intact. Degenerative changes throughout visualized thoracolumbar spine. IMPRESSION: Markedly distended small bowel loops throughout the mid and upper abdomen with interval worsening. Relative paucity of colonic gas. Findings suggest a worsening mid to distal small bowel obstruction. Correlate with CT abdomen/ pelvis as clinically warranted. Electronically Signed   By: Delbert PhenixJason A Poff M.D.   On: 11/16/2014 15:01   Dg Abd 2 Views  11/17/2014  CLINICAL DATA:  Follow-up of small bowel obstruction, improving symptoms. EXAM: ABDOMEN - 2 VIEW  COMPARISON:  Abdominal series of February 15, 2014 FINDINGS: There remain loops of mildly distended gas-filled small bowel in the midline. The degree of distention has slightly decreased. There is colonic gas and stool exhibiting a normal pattern. Somewhat more rectal gas is observed. IMPRESSION: Findings consistent with a partial mid to distal small bowel obstruction. Some improvement since yesterday's study has occurred. There is no evidence of perforation. Electronically Signed   By: David  [place] M.D.   On: 11/17/2014 09:32      Lab Results  Component Value Date   HGBA1C 6.1* 11/10/2014   Lab Results  Component Value Date   CREATININE 1.03 11/17/2014       Scheduled Meds: . levothyroxine  12.5 mcg Intravenous QAC breakfast  . metoprolol  2.5 mg Intravenous 3 times per day   Continuous Infusions: . 0.9 % NaCl with KCl 40 mEq / L 100 mL/hr (11/17/14 1191)    Active Problems:   Small bowel obstruction (HCC)   Atrial fibrillation (HCC)   Hypothyroidism   SBO (small bowel obstruction) (HCC)   Hypokalemia    Time spent: 45 minutes   [facility] Pager [phone]. If 7PM-7AM, please contact night-coverage at [email or web address], password [facility] 11/17/2014, 10:33 AM  LOS: 1 day

## 2014-11-17 NOTE — ED Provider Notes (Signed)
CSN: [id number]     Arrival date & time 11/16/14  1322 History   First MD Initiated Contact with Patient 11/16/14 1404     Chief Complaint  Patient presents with  . Diarrhea     (Consider location/radiation/quality/duration/timing/severity/associated sxs/prior Treatment) Patient is a 81 y.o. male presenting with diarrhea.  Diarrhea  Edward Fitzpatrick is a 81 year old male with a past medical history of Atrial fibrillation, DVT, recent admission for SBO who presents with worsening abdominal distention and diarrhea. Patient was discharged from the hospital on 11/14/2014 after he was treated for small bowel obstruction. Patient did not have surgery but required an NG tube to suction. Pt initially presented with diarrhea which pt states has not gone away and persisted throughout hospital stay. Patient having 4-5 loose vomitus per day. No hematochezia or melena. Pt states that he is able to drink fluids but not able to eat food since discharge. Denies nausea,vomiting, chest pain, no shortness of breath. No fever.   Past Medical History  Diagnosis Date  . Atrial fibrillation (HCC)   . S/P CABG x 1   . DVT (deep venous thrombosis) (HCC)   . Hypertension   . Hypothyroid   . Macular degeneration    Past Surgical History  Procedure Laterality Date  . Coronary artery bypass graft    . Appendectomy     No family history on file. Social History  Substance Use Topics  . Smoking status: Former Games developer  . Smokeless tobacco: None  . Alcohol Use: Yes     Comment: occasional     Review of Systems  Gastrointestinal: Positive for diarrhea.  All other systems reviewed and are negative.     Allergies  Review of patient's allergies indicates no known allergies.  Home Medications   Prior to Admission medications   Medication Sig Start Date End Date Taking? Authorizing Provider  acetaminophen (TYLENOL) 500 MG tablet Take 1,000 mg by mouth every 6 (six) hours as needed for mild pain or  headache.   Yes Historical Provider, MD  aspirin EC 81 MG tablet Take 81 mg by mouth daily.   Yes Historical Provider, MD  atorvastatin (LIPITOR) 20 MG tablet Take 20 mg by mouth daily.   Yes Historical Provider, MD  Cholecalciferol 1000 UNITS capsule Take 2,000 Units by mouth daily.  02/14/14  Yes Historical Provider, MD  cyanocobalamin 2000 MCG tablet Take 2,000 mcg by mouth daily.   Yes Historical Provider, MD  donepezil (ARICEPT) 5 MG tablet Take 5 mg by mouth daily at 6 PM. 05/02/14  Yes Historical Provider, MD  fluticasone (FLONASE) 50 MCG/ACT nasal spray Place 1 spray into both nostrils 2 (two) times daily. 10/11/14  Yes Historical Provider, MD  levothyroxine (SYNTHROID, LEVOTHROID) 25 MCG tablet Take 25 mcg by mouth daily before breakfast.   Yes Historical Provider, MD  losartan-hydrochlorothiazide (HYZAAR) 100-12.5 MG tablet Take 1 tablet by mouth daily.   Yes Historical Provider, MD  multivitamin-lutein (OCUVITE-LUTEIN) CAPS capsule Take 1 capsule by mouth daily.   Yes Historical Provider, MD  omeprazole (PRILOSEC) 40 MG capsule Take 40 mg by mouth daily.   Yes Historical Provider, MD  OVER THE COUNTER MEDICATION Eye wash, uses as needed for dry eyes   Yes Historical Provider, MD  ranitidine (ZANTAC) 150 MG tablet Take 150 mg by mouth daily. 10/08/14  Yes Historical Provider, MD  sodium chloride (OCEAN) 0.65 % SOLN nasal spray Place 1 spray into both nostrils as needed for congestion.   Yes Historical Provider, MD  traMADol (ULTRAM) 50 MG tablet Take 50 mg by mouth daily as needed for moderate pain.  11/03/14 11/03/15 Yes Historical Provider, MD  warfarin (COUMADIN) 5 MG tablet Take 5 mg by mouth daily at 6 PM. 05/09/14 05/09/15 Yes Historical Provider, MD  amLODipine (NORVASC) 2.5 MG tablet Take 1 tablet (2.5 mg total) by mouth daily. 11/14/14   Jerald KiefStephen K Chiu, MD  metoprolol (LOPRESSOR) 50 MG tablet Take 1 tablet (50 mg total) by mouth 2 (two) times daily. 11/14/14   Jerald KiefStephen K Chiu, MD   BP  114/63 mmHg  Pulse 104  Temp(Src) 97.5 F (36.4 C) (Oral)  Resp 18  Ht 5\' 7"  (1.702 m)  Wt 160 lb (72.576 kg)  BMI 25.05 kg/m2  SpO2 99% Physical Exam  Constitutional: He is oriented to person, place, and time. He appears well-developed and well-nourished. No distress.  HENT:  Head: Normocephalic and atraumatic.  Mouth/Throat: Oropharynx is clear and moist. No oropharyngeal exudate.  Eyes: Conjunctivae and EOM are normal. Pupils are equal, round, and reactive to light. Right eye exhibits no discharge. Left eye exhibits no discharge. No scleral icterus.  Neck: Neck supple.  Cardiovascular: Normal heart sounds and intact distal pulses.  Exam reveals no gallop and no friction rub.   No murmur heard. Irregularly irregular rhythm. Tachycardic to 119  Pulmonary/Chest: Effort normal and breath sounds normal. No respiratory distress. He has no wheezes. He has no rales. He exhibits no tenderness.  Abdominal: Soft. He exhibits distension. There is tenderness. There is no rebound and no guarding.  Musculoskeletal: Normal range of motion. He exhibits no edema or tenderness.  Lymphadenopathy:    He has no cervical adenopathy.  Neurological: He is alert and oriented to person, place, and time. No cranial nerve deficit.  Strength 5/5 throughout. No sensory deficits.    Skin: Skin is warm and dry. No rash noted. He is not diaphoretic. No erythema. No pallor.  Psychiatric: He has a normal mood and affect. His behavior is normal.  Nursing note and vitals reviewed.   ED Course  Procedures (including critical care time) Labs Review Labs Reviewed  LIPASE, BLOOD - Abnormal; Notable for the following:    Lipase 62 (*)    All other components within normal limits  COMPREHENSIVE METABOLIC PANEL - Abnormal; Notable for the following:    Potassium 3.1 (*)    Chloride 115 (*)    CO2 18 (*)    BUN 47 (*)    Creatinine, Ser 1.40 (*)    Calcium 8.1 (*)    Total Protein 5.4 (*)    Albumin 2.6 (*)     Total Bilirubin 1.3 (*)    GFR calc non Af Amer 41 (*)    GFR calc Af Amer 48 (*)    All other components within normal limits  CBC - Abnormal; Notable for the following:    WBC 11.0 (*)    RBC 4.17 (*)    HCT 38.0 (*)    All other components within normal limits  URINALYSIS, ROUTINE W REFLEX MICROSCOPIC (NOT AT [facility]) - Abnormal; Notable for the following:    Hgb urine dipstick TRACE (*)    All other components within normal limits  PROTIME-INR - Abnormal; Notable for the following:    Prothrombin Time 36.0 (*)    INR 3.73 (*)    All other components within normal limits  CBC - Abnormal; Notable for the following:    RBC 3.64 (*)    Hemoglobin 11.4 (*)  HCT 33.5 (*)    All other components within normal limits  COMPREHENSIVE METABOLIC PANEL - Abnormal; Notable for the following:    Potassium 3.2 (*)    Chloride 112 (*)    BUN 37 (*)    Calcium 7.6 (*)    Total Protein 4.9 (*)    Albumin 2.5 (*)    Total Bilirubin 1.4 (*)    GFR calc non Af Amer 60 (*)    All other components within normal limits  PROTIME-INR - Abnormal; Notable for the following:    Prothrombin Time 17.6 (*)    All other components within normal limits  URINE MICROSCOPIC-ADD ON - Abnormal; Notable for the following:    Squamous Epithelial / LPF FEW (*)    Bacteria, UA FEW (*)    Casts HYALINE CASTS (*)    All other components within normal limits  C DIFFICILE QUICK SCREEN W PCR REFLEX  LACTIC ACID, PLASMA  MAGNESIUM  CBC  CBC  COMPREHENSIVE METABOLIC PANEL  BASIC METABOLIC PANEL  CBG MONITORING, ED  PREPARE FRESH FROZEN PLASMA  TYPE AND SCREEN  ABO/RH    Imaging Review Dg Abd 1 View  11/16/2014  CLINICAL DATA:  Reason small-bowel obstruction. Diarrhea and nausea. EXAM: ABDOMEN - 1 VIEW COMPARISON:  11/11/2014 abdominal radiograph FINDINGS: Markedly distended small bowel loops throughout the mid and upper abdomen up to 6.6 cm diameter, with interval worsening. There is a relative paucity of  gas in the colon. No evidence of pneumatosis or pneumoperitoneum. Vascular calcifications are seen throughout the soft tissues. Curvilinear opacity is seen at the lateral left lung base. Visualized median sternotomy wires appear aligned and intact. Degenerative changes throughout visualized thoracolumbar spine. IMPRESSION: Markedly distended small bowel loops throughout the mid and upper abdomen with interval worsening. Relative paucity of colonic gas. Findings suggest a worsening mid to distal small bowel obstruction. Correlate with CT abdomen/ pelvis as clinically warranted. Electronically Signed   By: Delbert Phenix M.D.   On: 11/16/2014 15:01   Dg Abd 2 Views  11/17/2014  CLINICAL DATA:  Follow-up of small bowel obstruction, improving symptoms. EXAM: ABDOMEN - 2 VIEW COMPARISON:  Abdominal series of February 15, 2014 FINDINGS: There remain loops of mildly distended gas-filled small bowel in the midline. The degree of distention has slightly decreased. There is colonic gas and stool exhibiting a normal pattern. Somewhat more rectal gas is observed. IMPRESSION: Findings consistent with a partial mid to distal small bowel obstruction. Some improvement since yesterday's study has occurred. There is no evidence of perforation. Electronically Signed   By: David  [place] M.D.   On: 11/17/2014 09:32   I have personally reviewed and evaluated these images and lab results as part of my medical decision-making.   EKG Interpretation   Date/Time:  Wednesday November 16 2014 13:36:37 EDT Ventricular Rate:  123 PR Interval:    QRS Duration: 104 QT Interval:  324 QTC Calculation: 463 R Axis:   -77 Text Interpretation:  Atrial fibrillation LAD, consider left anterior  fascicular block Consider anterior infarct Baseline wander in lead(s) V2  V3 V4 V5 agree. no sig change from old Confirmed by Donnald Garre, MD, Lebron Conners  [phone]) on 11/16/2014 3:52:06 PM      MDM   Final diagnoses:  Small bowel obstruction [facility])   Atrial fibrillation, unspecified type [facility])    81 y.o M with recent admission for SBO (discharge 10/31) presents for worsening abdominal distention and diarrhea. Patient complained of diarrhea on initial presentation prior to  admission for small bowel obstruction. Since discharge diarrhea has continued. No melena or hematochezia. Pt having 4-5 loose stools per day. Abdominal x-ray reveals markedly distended small bowel loops throughout the mid and upper abdomen with interval worsening. Suggest a worsening mid to distal small bowel obstruction. Lipase elevated to 62. CBC 11. Potassium low at 3.1.General surgery consulted, recommended hospitalist admission and fluid administration which were given.    Spoke with hospitalist to admit patient to their service.    Lester Kinsman [place], PA-C 11/17/14 1901  Arby Barrette, MD 11/24/14 [phone]

## 2014-11-18 ENCOUNTER — Inpatient Hospital Stay (HOSPITAL_COMMUNITY): Payer: Medicare HMO

## 2014-11-18 DIAGNOSIS — I10 Essential (primary) hypertension: Secondary | ICD-10-CM

## 2014-11-18 LAB — COMPREHENSIVE METABOLIC PANEL
ALBUMIN: 2.5 g/dL — AB (ref 3.5–5.0)
ALK PHOS: 52 U/L (ref 38–126)
ALT: 26 U/L (ref 17–63)
ANION GAP: 8 (ref 5–15)
AST: 28 U/L (ref 15–41)
BUN: 24 mg/dL — ABNORMAL HIGH (ref 6–20)
CO2: 21 mmol/L — AB (ref 22–32)
Calcium: 7.8 mg/dL — ABNORMAL LOW (ref 8.9–10.3)
Chloride: 112 mmol/L — ABNORMAL HIGH (ref 101–111)
Creatinine, Ser: 0.94 mg/dL (ref 0.61–1.24)
GFR calc Af Amer: >60 mL/min (ref >60)
GFR calc non Af Amer: >60 mL/min (ref >60)
GLUCOSE: 90 mg/dL (ref 65–99)
POTASSIUM: 4.2 mmol/L (ref 3.5–5.1)
SODIUM: 141 mmol/L (ref 135–145)
Total Bilirubin: 1.5 mg/dL — ABNORMAL HIGH (ref 0.3–1.2)
Total Protein: 5 g/dL — ABNORMAL LOW (ref 6.5–8.1)

## 2014-11-18 LAB — CBC
HEMATOCRIT: 35.5 % — AB (ref 39.0–52.0)
Hemoglobin: 11.8 g/dL — ABNORMAL LOW (ref 13.0–17.0)
MCH: 30.9 pg (ref 26.0–34.0)
MCHC: 33.2 g/dL (ref 30.0–36.0)
MCV: 92.9 fL (ref 78.0–100.0)
PLATELETS: 202 10*3/uL (ref 150–400)
RBC: 3.82 MIL/uL — ABNORMAL LOW (ref 4.22–5.81)
RDW: 14.8 % (ref 11.5–15.5)
WBC: 9.7 10*3/uL (ref 4.0–10.5)

## 2014-11-18 LAB — PREPARE FRESH FROZEN PLASMA
UNIT DIVISION: 0
Unit division: 0

## 2014-11-18 MED ORDER — METOPROLOL TARTRATE 50 MG PO TABS
50.0000 mg | ORAL_TABLET | Freq: Two times a day (BID) | ORAL | Status: DC
  Administered 2014-11-18 – 2014-11-20 (×5): 50 mg via ORAL
  Filled 2014-11-18 (×5): qty 1

## 2014-11-18 MED ORDER — LEVOTHYROXINE SODIUM 25 MCG PO TABS
25.0000 ug | ORAL_TABLET | Freq: Every day | ORAL | Status: DC
  Administered 2014-11-19 – 2014-11-20 (×2): 25 ug via ORAL
  Filled 2014-11-18 (×2): qty 1

## 2014-11-18 MED ORDER — ENOXAPARIN SODIUM 40 MG/0.4ML ~~LOC~~ SOLN
40.0000 mg | SUBCUTANEOUS | Status: DC
  Administered 2014-11-18 – 2014-11-20 (×3): 40 mg via SUBCUTANEOUS
  Filled 2014-11-18 (×3): qty 0.4

## 2014-11-18 MED ORDER — ATORVASTATIN CALCIUM 20 MG PO TABS
20.0000 mg | ORAL_TABLET | Freq: Every day | ORAL | Status: DC
  Administered 2014-11-18 – 2014-11-19 (×2): 20 mg via ORAL
  Filled 2014-11-18 (×3): qty 1

## 2014-11-18 MED ORDER — ASPIRIN EC 81 MG PO TBEC
81.0000 mg | DELAYED_RELEASE_TABLET | Freq: Every day | ORAL | Status: DC
  Administered 2014-11-18 – 2014-11-20 (×3): 81 mg via ORAL
  Filled 2014-11-18 (×3): qty 1

## 2014-11-18 NOTE — Clinical Social Work Note (Signed)
Clinical Social Work Assessment  Patient Details  Name: Edward Fitzpatrick MRN: [id number] Date of Birth: 09/22/1916  Date of referral:  11/18/14               Reason for consult:  Facility Placement                Permission sought to share information with:    Permission granted to share information::     Name::        Agency::     Relationship::     Contact Information:     Housing/Transportation Living arrangements for the past 2 months:  Independent Living Facility Source of Information:  Patient Patient Interpreter Needed:  None Criminal Activity/Legal Involvement Pertinent to Current Situation/Hospitalization:  No - Comment as needed Significant Relationships:  Adult Children, Spouse Lives with:  Spouse Do you feel safe going back to the place where you live?  Yes Need for family participation in patient care:  Yes (Comment)  Care giving concerns:  None identified.    Social Worker assessment / plan:  CSW met with patient who advised that he has been at [facility] for the past 12 years.  He stated that for the first 11 years he was in the cottage, but how he and his wife of 74 years are in an apartment on the property. Patient reported that he receives 24 hour care in the apartment which was necessary due to his wife having a stroke 16 months ago.  Patient stated that he speaks with is children often as they live out of town and/or state which makes visiting infrequent.  Patient advised that he plans on returning to [facility] upon discharge.    Employment status:  Retired Insurance information:  Managed Medicare PT Recommendations:  Not assessed at this time Information / Referral to community resources:     Patient/Family's Response to care:  Patient is agreeable to return to [facility] upon discharge.  Patient/Family's Understanding of and Emotional Response to Diagnosis, Current Treatment, and Prognosis:  Patient verbalizes understanding of  his diagnosis, treatment and prognosis and feels that he will be appropriately cared for in his current independent living environment.   Emotional Assessment Appearance:  Appears stated age Attitude/Demeanor/Rapport:   (Cooperative) Affect (typically observed):  Calm Orientation:  Oriented to Self, Oriented to Place, Oriented to  Time, Oriented to Situation Alcohol / Substance use:  Not Applicable Psych involvement (Current and /or in the community):  No (Comment)  Discharge Needs  Concerns to be addressed:  No discharge needs identified Readmission within the last 30 days:  No Current discharge risk:  None Barriers to Discharge:  No Barriers Identified   ,  D, LCSW 11/18/2014, 12:25 PM [phone] 

## 2014-11-18 NOTE — Plan of Care (Signed)
Problem: Skin Integrity: Goal: Risk for impaired skin integrity will decrease Outcome: Completed/Met Date Met:  11/18/14 Patient turning side to side and ambulating

## 2014-11-18 NOTE — Progress Notes (Signed)
ANTICOAGULATION CONSULT NOTE - Initial Consult  Pharmacy Consult for Lovenox Indication: VTE prophylaxis  No Known Allergies  Patient Measurements: Height: 5\' 7"  (170.2 cm) Weight: 160 lb (72.576 kg) IBW/kg (Calculated) : 66.1  Vital Signs: Temp: 98.4 F (36.9 C) (11/04 0505) Temp Source: Oral (11/04 0505) BP: 118/68 mmHg (11/04 0505) Pulse Rate: 101 (11/04 0505)  Labs:  Recent Labs  11/16/14 1342 11/16/14 1347 11/17/14 0500 11/18/14 0508  HGB 13.0  --  11.4* 11.8*  HCT 38.0*  --  33.5* 35.5*  PLT 201  --  187 202  LABPROT  --  36.0* 17.6*  --   INR  --  3.73* 1.44  --   CREATININE 1.40*  --  1.03 0.94    Estimated Creatinine Clearance: 43.9 mL/min (by C-G formula based on Cr of 0.94).   Medical History: Past Medical History  Diagnosis Date  . Atrial fibrillation (HCC)   . S/P CABG x 1   . DVT (deep venous thrombosis) (HCC)   . Hypertension   . Hypothyroid   . Macular degeneration     Assessment: 89 yoM admitted on 11/2 with SBO, but no perforation.  PMH is significant for Afib and DVT on chronic warfarin anticoagulation.  Warfarin was held on admission and INR reversed with Vitamin K for possible NG tube placement.  INR is now sub-therapeutic and pharmacy is consulted to dose Lovenox for VTE prophylaxis. Home warfarin dose is reported as 5mg  daily.  Last dose on 11/1 with supratherapeutic INR 3.73 on admission.  Today, 11/18/2014: CBC: Hgb 11.8, Plt 202 SCr 0.94, CrCl ~ 44 ml/min. Last INR 1.44 (on 11/3)  Goal of Therapy:  VTE prophylaxis Monitor platelets by anticoagulation protocol: Yes   Plan:   Lovenox 40mg  SQ daily  Pharmacy will sign off at this time.  Please reconsult if a change in clinical status warrants re-evaluation of dosage.  Lynann Beaverhristine Tarique Loveall PharmD, BCPS Pager [phone] 11/18/2014 7:05 AM

## 2014-11-18 NOTE — Progress Notes (Signed)
Patient ID: Edward Fitzpatrick, male   DOB: [birth year], 81 y.o.   MRN: [id number]     [address] [place], [place] [address]    Phone: [phone] FAX: [phone]     Subjective: No abd pain, no n/v.  k normalized. Tolerating clears having BMs, diarrhea resolved  Objective:  Vital signs:  Filed Vitals:   11/17/14 1337 11/17/14 1400 11/17/14 2118 11/18/14 0505  BP: 115/67 114/63 116/72 118/68  Pulse: 106 104 102 101  Temp: 97.5 F (36.4 C)  98.4 F (36.9 C) 98.4 F (36.9 C)  TempSrc: Oral  Oral Oral  Resp: $Remo'18  18 16  'SmzDa$ Height:      Weight:      SpO2: 99%  98% 96%    Last BM Date: 11/17/14  Intake/Output   Yesterday:  11/03 0701 - 11/04 0700 In: 3476.7 [P.O.:720; I.V.:2156.7; IV Piggyback:600] Out: -  This shift:     Physical Exam: General: Pt awake/alert/oriented x4 in no acute distress Abdomen: +BS, abdomen is soft, non distended and non tender. No evidence of peritonitis. No incarcerated hernias.  Problem List:   Active Problems:   Small bowel obstruction (HCC)   Atrial fibrillation (HCC)   Hypothyroidism   SBO (small bowel obstruction) (HCC)   Hypokalemia    Results:   Labs: Results for orders placed or performed during the hospital encounter of 11/16/14 (from the past 48 hour(s))  CBG monitoring, ED     Status: None   Collection Time: 11/16/14  1:39 PM  Result Value Ref Range   Glucose-Capillary 96 65 - 99 mg/dL  Lipase, blood     Status: Abnormal   Collection Time: 11/16/14  1:42 PM  Result Value Ref Range   Lipase 62 (H) 11 - 51 U/L    Comment: Please note change in reference range.  Comprehensive metabolic panel     Status: Abnormal   Collection Time: 11/16/14  1:42 PM  Result Value Ref Range   Sodium 140 135 - 145 mmol/L   Potassium 3.1 (L) 3.5 - 5.1 mmol/L   Chloride 115 (H) 101 - 111 mmol/L   CO2 18 (L) 22 - 32 mmol/L   Glucose, Bld 99 65 - 99 mg/dL   BUN 47 (H) 6 -  20 mg/dL   Creatinine, Ser 1.40 (H) 0.61 - 1.24 mg/dL   Calcium 8.1 (L) 8.9 - 10.3 mg/dL   Total Protein 5.4 (L) 6.5 - 8.1 g/dL   Albumin 2.6 (L) 3.5 - 5.0 g/dL   AST 28 15 - 41 U/L   ALT 24 17 - 63 U/L   Alkaline Phosphatase 51 38 - 126 U/L   Total Bilirubin 1.3 (H) 0.3 - 1.2 mg/dL   GFR calc non Af Amer 41 (L) >60 mL/min   GFR calc Af Amer 48 (L) >60 mL/min    Comment: (NOTE) The eGFR has been calculated using the CKD EPI equation. This calculation has not been validated in all clinical situations. eGFR's persistently <60 mL/min signify possible Chronic Kidney Disease.    Anion gap 7 5 - 15  CBC     Status: Abnormal   Collection Time: 11/16/14  1:42 PM  Result Value Ref Range   WBC 11.0 (H) 4.0 - 10.5 K/uL   RBC 4.17 (L) 4.22 - 5.81 MIL/uL   Hemoglobin 13.0 13.0 - 17.0 g/dL   HCT 38.0 (L) 39.0 - 52.0 %   MCV  91.1 78.0 - 100.0 fL   MCH 31.2 26.0 - 34.0 pg   MCHC 34.2 30.0 - 36.0 g/dL   RDW 14.3 11.5 - 15.5 %   Platelets 201 150 - 400 K/uL  Protime-INR     Status: Abnormal   Collection Time: 11/16/14  1:47 PM  Result Value Ref Range   Prothrombin Time 36.0 (H) 11.6 - 15.2 seconds   INR 3.73 (H) 0.00 - 1.49  Lactic acid, plasma     Status: None   Collection Time: 11/16/14  4:44 PM  Result Value Ref Range   Lactic Acid, Venous 1.0 0.5 - 2.0 mmol/L  Prepare fresh frozen plasma     Status: None   Collection Time: 11/16/14  5:30 PM  Result Value Ref Range   Unit Number [id number]    Blood Component Type THWPLS APHR1    Unit division 00    Status of Unit ISSUED,FINAL    Transfusion Status OK TO TRANSFUSE    Unit Number [id number]    Blood Component Type THAWED PLASMA    Unit division 00    Status of Unit ISSUED,FINAL    Transfusion Status OK TO TRANSFUSE   Type and screen     Status: None   Collection Time: 11/16/14  5:30 PM  Result Value Ref Range   ABO/RH(D) A POS    Antibody Screen NEG    Sample Expiration 11/19/2014   ABO/Rh     Status: None   Collection  Time: 11/16/14  5:30 PM  Result Value Ref Range   ABO/RH(D) A POS   C difficile quick scan w PCR reflex     Status: None   Collection Time: 11/16/14  6:00 PM  Result Value Ref Range   C Diff antigen NEGATIVE NEGATIVE   C Diff toxin NEGATIVE NEGATIVE   C Diff interpretation Negative for toxigenic C. difficile   Urinalysis, Routine w reflex microscopic (not at [facility])     Status: Abnormal   Collection Time: 11/16/14  6:09 PM  Result Value Ref Range   Color, Urine YELLOW YELLOW   APPearance CLEAR CLEAR   Specific Gravity, Urine 1.023 1.005 - 1.030   pH 5.5 5.0 - 8.0   Glucose, UA NEGATIVE NEGATIVE mg/dL   Hgb urine dipstick TRACE (A) NEGATIVE   Bilirubin Urine NEGATIVE NEGATIVE   Ketones, ur NEGATIVE NEGATIVE mg/dL   Protein, ur NEGATIVE NEGATIVE mg/dL   Urobilinogen, UA 0.2 0.0 - 1.0 mg/dL   Nitrite NEGATIVE NEGATIVE   Leukocytes, UA NEGATIVE NEGATIVE  Urine microscopic-add on     Status: Abnormal   Collection Time: 11/16/14  6:09 PM  Result Value Ref Range   Squamous Epithelial / LPF FEW (A) RARE   WBC, UA 0-2 <3 WBC/hpf   RBC / HPF 0-2 <3 RBC/hpf   Bacteria, UA FEW (A) RARE   Casts HYALINE CASTS (A) NEGATIVE  CBC     Status: Abnormal   Collection Time: 11/17/14  5:00 AM  Result Value Ref Range   WBC 8.6 4.0 - 10.5 K/uL   RBC 3.64 (L) 4.22 - 5.81 MIL/uL   Hemoglobin 11.4 (L) 13.0 - 17.0 g/dL   HCT 33.5 (L) 39.0 - 52.0 %   MCV 92.0 78.0 - 100.0 fL   MCH 31.3 26.0 - 34.0 pg   MCHC 34.0 30.0 - 36.0 g/dL   RDW 14.6 11.5 - 15.5 %   Platelets 187 150 - 400 K/uL  Comprehensive metabolic panel     Status:  Abnormal   Collection Time: 11/17/14  5:00 AM  Result Value Ref Range   Sodium 140 135 - 145 mmol/L   Potassium 3.2 (L) 3.5 - 5.1 mmol/L   Chloride 112 (H) 101 - 111 mmol/L   CO2 22 22 - 32 mmol/L   Glucose, Bld 82 65 - 99 mg/dL   BUN 37 (H) 6 - 20 mg/dL   Creatinine, Ser 1.03 0.61 - 1.24 mg/dL   Calcium 7.6 (L) 8.9 - 10.3 mg/dL   Total Protein 4.9 (L) 6.5 - 8.1 g/dL    Albumin 2.5 (L) 3.5 - 5.0 g/dL   AST 28 15 - 41 U/L   ALT 23 17 - 63 U/L   Alkaline Phosphatase 49 38 - 126 U/L   Total Bilirubin 1.4 (H) 0.3 - 1.2 mg/dL   GFR calc non Af Amer 60 (L) >60 mL/min   GFR calc Af Amer >60 >60 mL/min    Comment: (NOTE) The eGFR has been calculated using the CKD EPI equation. This calculation has not been validated in all clinical situations. eGFR's persistently <60 mL/min signify possible Chronic Kidney Disease.    Anion gap 6 5 - 15  Protime-INR     Status: Abnormal   Collection Time: 11/17/14  5:00 AM  Result Value Ref Range   Prothrombin Time 17.6 (H) 11.6 - 15.2 seconds   INR 1.44 0.00 - 1.49  Magnesium     Status: None   Collection Time: 11/17/14  5:00 AM  Result Value Ref Range   Magnesium 1.7 1.7 - 2.4 mg/dL  Glucose, capillary     Status: None   Collection Time: 11/17/14  9:16 PM  Result Value Ref Range   Glucose-Capillary 82 65 - 99 mg/dL  CBC     Status: Abnormal   Collection Time: 11/18/14  5:08 AM  Result Value Ref Range   WBC 9.7 4.0 - 10.5 K/uL   RBC 3.82 (L) 4.22 - 5.81 MIL/uL   Hemoglobin 11.8 (L) 13.0 - 17.0 g/dL   HCT 35.5 (L) 39.0 - 52.0 %   MCV 92.9 78.0 - 100.0 fL   MCH 30.9 26.0 - 34.0 pg   MCHC 33.2 30.0 - 36.0 g/dL   RDW 14.8 11.5 - 15.5 %   Platelets 202 150 - 400 K/uL  Comprehensive metabolic panel     Status: Abnormal   Collection Time: 11/18/14  5:08 AM  Result Value Ref Range   Sodium 141 135 - 145 mmol/L   Potassium 4.2 3.5 - 5.1 mmol/L    Comment: DELTA CHECK NOTED REPEATED TO VERIFY NO VISIBLE HEMOLYSIS    Chloride 112 (H) 101 - 111 mmol/L   CO2 21 (L) 22 - 32 mmol/L   Glucose, Bld 90 65 - 99 mg/dL   BUN 24 (H) 6 - 20 mg/dL   Creatinine, Ser 0.94 0.61 - 1.24 mg/dL   Calcium 7.8 (L) 8.9 - 10.3 mg/dL   Total Protein 5.0 (L) 6.5 - 8.1 g/dL   Albumin 2.5 (L) 3.5 - 5.0 g/dL   AST 28 15 - 41 U/L   ALT 26 17 - 63 U/L   Alkaline Phosphatase 52 38 - 126 U/L   Total Bilirubin 1.5 (H) 0.3 - 1.2 mg/dL   GFR  calc non Af Amer >60 >60 mL/min   GFR calc Af Amer >60 >60 mL/min    Comment: (NOTE) The eGFR has been calculated using the CKD EPI equation. This calculation has not been validated in all clinical  situations. eGFR's persistently <60 mL/min signify possible Chronic Kidney Disease.    Anion gap 8 5 - 15    Imaging / Studies: Dg Abd 1 View  11/16/2014  CLINICAL DATA:  Reason small-bowel obstruction. Diarrhea and nausea. EXAM: ABDOMEN - 1 VIEW COMPARISON:  11/11/2014 abdominal radiograph FINDINGS: Markedly distended small bowel loops throughout the mid and upper abdomen up to 6.6 cm diameter, with interval worsening. There is a relative paucity of gas in the colon. No evidence of pneumatosis or pneumoperitoneum. Vascular calcifications are seen throughout the soft tissues. Curvilinear opacity is seen at the lateral left lung base. Visualized median sternotomy wires appear aligned and intact. Degenerative changes throughout visualized thoracolumbar spine. IMPRESSION: Markedly distended small bowel loops throughout the mid and upper abdomen with interval worsening. Relative paucity of colonic gas. Findings suggest a worsening mid to distal small bowel obstruction. Correlate with CT abdomen/ pelvis as clinically warranted. Electronically Signed   By: Ilona Sorrel M.D.   On: 11/16/2014 15:01   Dg Abd 2 Views  11/17/2014  CLINICAL DATA:  Follow-up of small bowel obstruction, improving symptoms. EXAM: ABDOMEN - 2 VIEW COMPARISON:  Abdominal series of February 15, 2014 FINDINGS: There remain loops of mildly distended gas-filled small bowel in the midline. The degree of distention has slightly decreased. There is colonic gas and stool exhibiting a normal pattern. Somewhat more rectal gas is observed. IMPRESSION: Findings consistent with a partial mid to distal small bowel obstruction. Some improvement since yesterday's study has occurred. There is no evidence of perforation. Electronically Signed   By: David   Martinique M.D.   On: 11/17/2014 09:32    Medications / Allergies:  Scheduled Meds: . enoxaparin (LOVENOX) injection  40 mg Subcutaneous Q24H  . levothyroxine  12.5 mcg Intravenous QAC breakfast  . metoprolol  2.5 mg Intravenous 3 times per day   Continuous Infusions: . 0.9 % NaCl with KCl 40 mEq / L 100 mL/hr (11/18/14 0100)   PRN Meds:.  Antibiotics: Anti-infectives    None       Assessment/Plan pSBO-resolved.  Advance to fulls today.  Will discuss with Dr. Dalbert Batman when okay to resume coumadin. Needs to mobilize, PT order placed.  Garner Gavel with RVR-rate improved Chronic anticoagulation-hold coumadin for now Hypothyroidism Hx appendectomy  Hx CAD/CABG, HTN  Aireal Slater, ANP-BC [facility] Pager [phone](7A-4:30P)   11/18/2014 8:56 AM

## 2014-11-18 NOTE — Progress Notes (Signed)
[facility] PROGRESS NOTE  Samaj Wessells ZOX:[id number] DOB: 1916-07-02 DOA: 11/16/2014 PCP: Bill Salinas I, NP  Assessment/Plan: 81 y/o male with PMH of HTN, CAD h/o CABG, A fib,  recently admitted with SBO (discharged on 10/31) is admitted with abdominal pains, and recurrent SBO. And AKI  1. SBO. Patient is improviong,. Repeat abdominal x ray: improved  -starting full liquid diet today. appreciate surgery input   2. A fib RVR. HR not controlled likely due SBO+to NPO/ not on BB. -will resume oral BB today. Resume coumadin per surgery 3. CAD h/o CABG. No active chest pains. Resume ASA, BB, statins  4. HTN, resume BB, hold diuretics     Code Status: DNR Family Communication: d/w patient, RN (indicate person spoken with, relationship, and if by phone, the number) Disposition Plan: home pend clinical improvement    Consultants:  surgery   Procedures:  none  Antibiotics:  none (indicate start date, and stop date if known)  HPI/Subjective: Alert, denies pains   Objective: Filed Vitals:   11/18/14 0505  BP: 118/68  Pulse: 101  Temp: 98.4 F (36.9 C)  Resp: 16    Intake/Output Summary (Last 24 hours) at 11/18/14 1007 Last data filed at 11/18/14 0600  Gross per 24 hour  Intake 3476.67 ml  Output      0 ml  Net 3476.67 ml   Filed Weights   11/16/14 1653  Weight: 72.576 kg (160 lb)    Exam:   General:  No distress   Cardiovascular: s1, s2 rrr  Respiratory: CTA BL   Abdomen: soft, nt,nd   Musculoskeletal:  No leg edema    Data Reviewed: Basic Metabolic Panel:  Recent Labs Lab 11/13/14 0601 11/14/14 0437 11/16/14 1342 11/17/14 0500 11/18/14 0508  NA 143 140 140 140 141  K 3.6 3.8 3.1* 3.2* 4.2  CL 110 109 115* 112* 112*  CO2 27 22 18* 22 21*  GLUCOSE 125* 150* 99 82 90  BUN 38* 33* 47* 37* 24*  CREATININE 1.11 1.03 1.40* 1.03 0.94  CALCIUM 8.2* 8.4* 8.1* 7.6* 7.8*  MG  --   --   --  1.7  --    Liver Function Tests:  Recent  Labs Lab 11/16/14 1342 11/17/14 0500 11/18/14 0508  AST ALT ALKPHOS 51 49 52  BILITOT 1.3* 1.4* 1.5*  PROT 5.4* 4.9* 5.0*  ALBUMIN 2.6* 2.5* 2.5*    Recent Labs Lab 11/16/14 1342  LIPASE 62*   No results for input(s): AMMONIA in the last 168 hours. CBC:  Recent Labs Lab 11/12/14 0625 11/16/14 1342 11/17/14 0500 11/18/14 0508  WBC 6.6 11.0* 8.6 9.7  HGB 14.3 13.0 11.4* 11.8*  HCT 41.4 38.0* 33.5* 35.5*  MCV 92.0 91.1 92.0 92.9  PLT 208 201 187 202   Cardiac Enzymes: No results for input(s): CKTOTAL, CKMB, CKMBINDEX, TROPONINI in the last 168 hours. BNP (last 3 results) No results for input(s): BNP in the last 8760 hours.  ProBNP (last 3 results) No results for input(s): PROBNP in the last 8760 hours.  CBG:  Recent Labs Lab 11/11/14 1739 11/12/14 0835 11/12/14 1144 11/16/14 1339 11/17/14 2116  GLUCAP 140* 131* 117* 96 82    Recent Results (from the past 240 hour(s))  C difficile quick scan w PCR reflex     Status: None   Collection Time: 11/16/14  6:00 PM  Result Value Ref Range Status   C Diff antigen NEGATIVE NEGATIVE Final  C Diff toxin NEGATIVE NEGATIVE Final   C Diff interpretation Negative for toxigenic C. difficile  Final     Studies: Dg Abd 1 View  11/16/2014  CLINICAL DATA:  Reason small-bowel obstruction. Diarrhea and nausea. EXAM: ABDOMEN - 1 VIEW COMPARISON:  11/11/2014 abdominal radiograph FINDINGS: Markedly distended small bowel loops throughout the mid and upper abdomen up to 6.6 cm diameter, with interval worsening. There is a relative paucity of gas in the colon. No evidence of pneumatosis or pneumoperitoneum. Vascular calcifications are seen throughout the soft tissues. Curvilinear opacity is seen at the lateral left lung base. Visualized median sternotomy wires appear aligned and intact. Degenerative changes throughout visualized thoracolumbar spine. IMPRESSION: Markedly distended small bowel loops throughout the  mid and upper abdomen with interval worsening. Relative paucity of colonic gas. Findings suggest a worsening mid to distal small bowel obstruction. Correlate with CT abdomen/ pelvis as clinically warranted. Electronically Signed   By: Delbert PhenixJason A Poff M.D.   On: 11/16/2014 15:01   Dg Abd 2 Views  11/18/2014  CLINICAL DATA:  Abdominal distension a little bit diarrhea. EXAM: ABDOMEN - 2 VIEW COMPARISON:  Radiograph 11/17/2014 FINDINGS: Dominant gas-filled loop of small bowel measures 4.4 cm decreased from 5.6 cm. There is gas and stool throughout the colon. No intraperitoneal free air. IMPRESSION: Improved small bowel distention without evidence high-grade obstruction. Electronically Signed   By: Genevive BiStewart  Edmunds M.D.   On: 11/18/2014 08:45   Dg Abd 2 Views  11/17/2014  CLINICAL DATA:  Follow-up of small bowel obstruction, improving symptoms. EXAM: ABDOMEN - 2 VIEW COMPARISON:  Abdominal series of February 15, 2014 FINDINGS: There remain loops of mildly distended gas-filled small bowel in the midline. The degree of distention has slightly decreased. There is colonic gas and stool exhibiting a normal pattern. Somewhat more rectal gas is observed. IMPRESSION: Findings consistent with a partial mid to distal small bowel obstruction. Some improvement since yesterday's study has occurred. There is no evidence of perforation. Electronically Signed   By: David  SwazilandJordan M.D.   On: 11/17/2014 09:32    Scheduled Meds: . enoxaparin (LOVENOX) injection  40 mg Subcutaneous Q24H  . levothyroxine  12.5 mcg Intravenous QAC breakfast  . metoprolol  2.5 mg Intravenous 3 times per day   Continuous Infusions: . 0.9 % NaCl with KCl 40 mEq / L 100 mL/hr (11/18/14 0100)    Active Problems:   Small bowel obstruction (HCC)   Atrial fibrillation (HCC)   Hypothyroidism   SBO (small bowel obstruction) (HCC)   Hypokalemia    Time spent: >35 minutes     Esperanza SheetsBURIEV, Liora Myles N  [facility] Pager [phone]. If 7PM-7AM,  please contact night-coverage at [email or web address], password [email or web address] 11/18/2014, 10:07 AM  LOS: 2 days

## 2014-11-19 ENCOUNTER — Inpatient Hospital Stay (HOSPITAL_COMMUNITY): Payer: Medicare HMO

## 2014-11-19 DIAGNOSIS — E876 Hypokalemia: Secondary | ICD-10-CM

## 2014-11-19 DIAGNOSIS — R748 Abnormal levels of other serum enzymes: Secondary | ICD-10-CM | POA: Diagnosis present

## 2014-11-19 DIAGNOSIS — E039 Hypothyroidism, unspecified: Secondary | ICD-10-CM

## 2014-11-19 DIAGNOSIS — K5669 Other intestinal obstruction: Secondary | ICD-10-CM

## 2014-11-19 DIAGNOSIS — R197 Diarrhea, unspecified: Secondary | ICD-10-CM | POA: Diagnosis present

## 2014-11-19 MED ORDER — WARFARIN SODIUM 2.5 MG PO TABS
2.5000 mg | ORAL_TABLET | Freq: Once | ORAL | Status: AC
  Administered 2014-11-19: 2.5 mg via ORAL
  Filled 2014-11-19 (×2): qty 1

## 2014-11-19 MED ORDER — WARFARIN - PHARMACIST DOSING INPATIENT: Freq: Every day | Status: DC

## 2014-11-19 MED ORDER — SALINE SPRAY 0.65 % NA SOLN
1.0000 | NASAL | Status: DC | PRN
  Administered 2014-11-19: 1 via NASAL
  Filled 2014-11-19: qty 44

## 2014-11-19 NOTE — Progress Notes (Signed)
Patient ID: Edward Fitzpatrick, male   DOB: [birth year], 81 y.o.   MRN: [id number]     [address] [place], [place] [address]    Phone: [phone] FAX: [phone]     Subjective: No abd pain, no n/v.  k normalized. Tolerating clears having BMs, diarrhea resolved  Objective:  Vital signs:  Filed Vitals:   11/18/14 0505 11/18/14 1423 11/18/14 2056 11/19/14 0430  BP: 118/68 123/66 94/76 120/88  Pulse: 101 101 100 77  Temp: 98.4 F (36.9 C) 98.3 F (36.8 C) 97.8 F (36.6 C) 98.2 F (36.8 C)  TempSrc: Oral Oral Oral Oral  Resp: $Remo'16 16 16 16  'izFVT$ Height:      Weight:      SpO2: 96% 99% 100% 99%    Last BM Date: 11/18/14  Intake/Output   Yesterday:  11/04 0701 - 11/05 0700 In: 8185 [P.O.:1200; I.V.:2010] Out: -  This shift:  Total I/O In: 360 [P.O.:360] Out: -   Physical Exam: General: Pt awake/alert/oriented x4 in no acute distress Abdomen: +BS, abdomen is soft, non distended and non tender. No evidence of peritonitis. No incarcerated hernias.  Problem List:   Principal Problem:   Small bowel obstruction (HCC) Active Problems:   Atrial fibrillation (HCC)   Hypothyroidism   Hypokalemia   Elevated lipase    Results:   Labs: Results for orders placed or performed during the hospital encounter of 11/16/14 (from the past 48 hour(s))  Glucose, capillary     Status: None   Collection Time: 11/17/14  9:16 PM  Result Value Ref Range   Glucose-Capillary 82 65 - 99 mg/dL  CBC     Status: Abnormal   Collection Time: 11/18/14  5:08 AM  Result Value Ref Range   WBC 9.7 4.0 - 10.5 K/uL   RBC 3.82 (L) 4.22 - 5.81 MIL/uL   Hemoglobin 11.8 (L) 13.0 - 17.0 g/dL   HCT 35.5 (L) 39.0 - 52.0 %   MCV 92.9 78.0 - 100.0 fL   MCH 30.9 26.0 - 34.0 pg   MCHC 33.2 30.0 - 36.0 g/dL   RDW 14.8 11.5 - 15.5 %   Platelets 202 150 - 400 K/uL  Comprehensive metabolic panel     Status: Abnormal   Collection Time:  11/18/14  5:08 AM  Result Value Ref Range   Sodium 141 135 - 145 mmol/L   Potassium 4.2 3.5 - 5.1 mmol/L    Comment: DELTA CHECK NOTED REPEATED TO VERIFY NO VISIBLE HEMOLYSIS    Chloride 112 (H) 101 - 111 mmol/L   CO2 21 (L) 22 - 32 mmol/L   Glucose, Bld 90 65 - 99 mg/dL   BUN 24 (H) 6 - 20 mg/dL   Creatinine, Ser 0.94 0.61 - 1.24 mg/dL   Calcium 7.8 (L) 8.9 - 10.3 mg/dL   Total Protein 5.0 (L) 6.5 - 8.1 g/dL   Albumin 2.5 (L) 3.5 - 5.0 g/dL   AST 28 15 - 41 U/L   ALT 26 17 - 63 U/L   Alkaline Phosphatase 52 38 - 126 U/L   Total Bilirubin 1.5 (H) 0.3 - 1.2 mg/dL   GFR calc non Af Amer >60 >60 mL/min   GFR calc Af Amer >60 >60 mL/min    Comment: (NOTE) The eGFR has been calculated using the CKD EPI equation. This calculation has not been validated in all clinical situations. eGFR's persistently <60 mL/min signify possible Chronic  Kidney Disease.    Anion gap 8 5 - 15    Imaging / Studies: Dg Abd 2 Views  11/18/2014  CLINICAL DATA:  Abdominal distension a little bit diarrhea. EXAM: ABDOMEN - 2 VIEW COMPARISON:  Radiograph 11/17/2014 FINDINGS: Dominant gas-filled loop of small bowel measures 4.4 cm decreased from 5.6 cm. There is gas and stool throughout the colon. No intraperitoneal free air. IMPRESSION: Improved small bowel distention without evidence high-grade obstruction. Electronically Signed   By: Suzy Bouchard M.D.   On: 11/18/2014 08:45    Medications / Allergies:  Scheduled Meds: . aspirin EC  81 mg Oral Daily  . atorvastatin  20 mg Oral q1800  . enoxaparin (LOVENOX) injection  40 mg Subcutaneous Q24H  . levothyroxine  25 mcg Oral QAC breakfast  . metoprolol tartrate  50 mg Oral BID   Continuous Infusions: . 0.9 % NaCl with KCl 40 mEq / L 75 mL/hr (11/19/14 0236)   PRN Meds:.  Antibiotics: Anti-infectives    None       Assessment/Plan pSBO-resolved.  Would cont FLD for the next 2 weeks.  Most likely has a strictured area in his small bowel that would  account for his intermittent symptoms.  D/C per primary team Needs to mobilize, PT order placed but they haven't seen him yet  Garner Gavel with RVR-rate improved Chronic anticoagulation- could restart, given he refuses any surgical intervetion Hypothyroidism Hx appendectomy  Hx CAD/CABG, HTN    Rosario Adie, MD  Colorectal and General Surgery [facility]   11/19/2014 10:22 AM

## 2014-11-19 NOTE — Progress Notes (Signed)
Report received from [facility] LLCNatasha RN . Agree with previous shift assessment.  Earnest ConroyBrooke M. Clelia CroftShaw, RN

## 2014-11-19 NOTE — Progress Notes (Signed)
Progress Note   Edward Fitzpatrick UJW:[id number] DOB: November 13, 1916 DOA: 11/16/2014 PCP: Edward Salinas I, NP   Brief Narrative:   Edward Fitzpatrick is an 81 y.o. male with a PMH of atrial fibrillation on chronic Coumadin, CAD status post CABG, DVT, hypertension, hypothyroidism, and recent hospitalization from 11/10/14-11/14/14 for treatment of SBO which appeared to have resolved by discharge, who was readmitted 11/16/14 with a chief complaint of a 2 day history of 4-5 loose stools/day and inability to eat. Abdominal films done in the ED showed findings consistent with mid to distal SBO.  Assessment/Plan:   Principal Problem:   Small bowel obstruction (HCC) / diarrhea - Being followed by surgery with ongoing recommendations for conservative care. Stools for C. Diff negative. - Follow-up abdominal films done 11/17/14 showed improvement. Repeat abdominal films today. - Full liquid diet. - Mobilize. D/C telemetry.  Active Problems:   Coronary artery disease - Continue aspirin, statin and beta blocker.    Atrial fibrillation (HCC) - Coumadin and metoprolol resumed 11/18/14.    Hypothyroidism - Continue Synthroid.    Hypokalemia - Repleted.    Elevated lipase - Hydrated.    DVT Prophylaxis - Lovenox ordered.   Family Communication/Anticipated D/C date and plan/Code Status   Family Communication: No family at the bedside.  Called Edward Fitzpatrick [phone]) & updated. Disposition Plan: From [facility].  Will return there. Anticipated D/C date:   1 to 2 days, depending on resolution of SBO, F/U films, etc. Code Status:     Code Status Orders        Start     Ordered   11/16/14 1643  Do not attempt resuscitation (DNR)   Continuous    Question Answer Comment  In the event of cardiac or respiratory ARREST Do not call a "code blue"   In the event of cardiac or respiratory ARREST Do not perform Intubation, CPR, defibrillation or ACLS   In the  event of cardiac or respiratory ARREST Use medication by any route, position, wound care, and other measures to relive pain and suffering. May use oxygen, suction and manual treatment of airway obstruction as needed for comfort.      11/16/14 1642    Advance Directive Documentation        Most Recent Value   Type of Advance Directive  Out of facility DNR (pink MOST or yellow form)   Pre-existing out of facility DNR order (yellow form or pink MOST form)  Yellow form placed in chart (order not valid for inpatient use)   "MOST" Form in Place?         IV Access:    Peripheral IV   Procedures and diagnostic studies:   Dg Abd 1 View  11/16/2014  CLINICAL DATA:  Reason small-bowel obstruction. Diarrhea and nausea. EXAM: ABDOMEN - 1 VIEW COMPARISON:  11/11/2014 abdominal radiograph FINDINGS: Markedly distended small bowel loops throughout the mid and upper abdomen up to 6.6 cm diameter, with interval worsening. There is a relative paucity of gas in the colon. No evidence of pneumatosis or pneumoperitoneum. Vascular calcifications are seen throughout the soft tissues. Curvilinear opacity is seen at the lateral left lung base. Visualized median sternotomy wires appear aligned and intact. Degenerative changes throughout visualized thoracolumbar spine. IMPRESSION: Markedly distended small bowel loops throughout the mid and upper abdomen with interval worsening. Relative paucity of colonic gas. Findings suggest a worsening mid to distal small bowel obstruction. Correlate with CT abdomen/ pelvis as clinically warranted. Electronically  Signed   By: Edward Fitzpatrick.   On: 11/16/2014 15:01   Dg Abd 2 Views  11/17/2014  CLINICAL DATA:  Follow-up of small bowel obstruction, improving symptoms. EXAM: ABDOMEN - 2 VIEW COMPARISON:  Abdominal series of February 15, 2014 FINDINGS: There remain loops of mildly distended gas-filled small bowel in the midline. The degree of distention has slightly decreased. There is  colonic gas and stool exhibiting a normal pattern. Somewhat more rectal gas is observed. IMPRESSION: Findings consistent with a partial mid to distal small bowel obstruction. Some improvement since yesterday's study has occurred. There is no evidence of perforation. Electronically Signed   By: Edward  [place] Fitzpatrick.   On: 11/17/2014 09:32     Medical Consultants:    None.  Anti-Infectives:   Anti-infectives    None      Subjective:    Edward Fitzpatrick tells me he is passing flatus and that his bowels are moving and nursing confirms that he has had some formed stools as well as a loose stool this morning.  Says he ate some breakfast. Has not been out of bed yet.  Objective:    Filed Vitals:   11/18/14 0505 11/18/14 1423 11/18/14 2056 11/19/14 0430  BP: 118/68 123/66 94/76 120/88  Pulse: 101 101 100 77  Temp: 98.4 F (36.9 C) 98.3 F (36.8 C) 97.8 F (36.6 C) 98.2 F (36.8 C)  TempSrc: Oral Oral Oral Oral  Resp: 16 16 16 16   Height:      Weight:      SpO2: 96% 99% 100% 99%    Intake/Output Summary (Last 24 hours) at 11/19/14 1030 Last data filed at 11/19/14 0900  Gross per 24 hour  Intake   3090 ml  Output      0 ml  Net   3090 ml   Filed Weights   11/16/14 1653  Weight: 72.576 kg (160 lb)    Exam: Gen:  NAD Cardiovascular:  RRR, No M/R/G Respiratory:  Lungs CTAB Gastrointestinal:  Abdomen soft, NT/ND, + BS* Extremities:  No C/E/C   Data Reviewed:    Labs: Basic Metabolic Panel:  Recent Labs Lab 11/13/14 0601 11/14/14 0437 11/16/14 1342 11/17/14 0500 11/18/14 0508  NA 143 140 140 140 141  K 3.6 3.8 3.1* 3.2* 4.2  CL 110 109 115* 112* 112*  CO2 27 22 18* 22 21*  GLUCOSE 125* 150* 99 82 90  BUN 38* 33* 47* 37* 24*  CREATININE 1.11 1.03 1.40* 1.03 0.94  CALCIUM 8.2* 8.4* 8.1* 7.6* 7.8*  MG  --   --   --  1.7  --    GFR Estimated Creatinine Clearance: 43.9 mL/min (by C-G formula based on Cr of 0.94). Liver Function Tests:  Recent Labs Lab  11/16/14 1342 11/17/14 0500 11/18/14 0508  AST 28 28 28   ALT 24 23 26   ALKPHOS 51 49 52  BILITOT 1.3* 1.4* 1.5*  PROT 5.4* 4.9* 5.0*  ALBUMIN 2.6* 2.5* 2.5*    Recent Labs Lab 11/16/14 1342  LIPASE 62*   Coagulation profile  Recent Labs Lab 11/13/14 0601 11/14/14 0955 11/16/14 1347 11/17/14 0500  INR 3.18* 2.07* 3.73* 1.44    CBC:  Recent Labs Lab 11/16/14 1342 11/17/14 0500 11/18/14 0508  WBC 11.0* 8.6 9.7  HGB 13.0 11.4* 11.8*  HCT 38.0* 33.5* 35.5*  MCV 91.1 92.0 92.9  PLT 201 187 202   CBG:  Recent Labs Lab 11/12/14 1144 11/16/14 1339 11/17/14 2116  GLUCAP 117* 96  82   Sepsis Labs:  Recent Labs Lab 11/16/14 1342 11/16/14 1644 11/17/14 0500 11/18/14 0508  WBC 11.0*  --  8.6 9.7  LATICACIDVEN  --  1.0  --   --    Microbiology Recent Results (from the past 240 hour(s))  C difficile quick scan w PCR reflex     Status: None   Collection Time: 11/16/14  6:00 PM  Result Value Ref Range Status   C Diff antigen NEGATIVE NEGATIVE Final   C Diff toxin NEGATIVE NEGATIVE Final   C Diff interpretation Negative for toxigenic C. difficile  Final     Medications:   . aspirin EC  81 mg Oral Daily  . atorvastatin  20 mg Oral q1800  . enoxaparin (LOVENOX) injection  40 mg Subcutaneous Q24H  . levothyroxine  25 mcg Oral QAC breakfast  . metoprolol tartrate  50 mg Oral BID   Continuous Infusions: . 0.9 % NaCl with KCl 40 mEq / L 75 mL/hr (11/19/14 0236)    Time spent: 25 minutes.   LOS: 3 days   Raedyn Wenke  [facility] Pager [phone]. If unable to reach me by pager, please call my cell phone at [phone].  *Please refer to [email or web address], password [email or web address] to get updated schedule on who will round on this patient, as hospitalists switch teams weekly. If 7PM-7AM, please contact night-coverage at [email or web address], password [email or web address] for any overnight needs.  11/19/2014, 10:30 AM

## 2014-11-19 NOTE — Evaluation (Signed)
Physical Therapy Evaluation Patient Details Name: Jonas Goh MRN: [id number] DOB: 01-28-1916 Today's Date: 11/19/2014   History of Present Illness  [place] Katich is an 81 y.o. male with a PMH of atrial fibrillation on chronic Coumadin, CAD status post CABG, DVT, hypertension, hypothyroidism, and recent hospitalization from 11/10/14-11/14/14 for treatment of SBO which appeared to have resolved by discharge, who was readmitted 11/16/14 with a chief complaint of a 2 day history of 4-5 loose stools/day and inability to eat. Abdominal films done in the ED showed findings consistent with mid to distal SBO  Clinical Impression  Pt admitted with above diagnosis. Pt currently with functional limitations due to the deficits listed below (see PT Problem List).  Pt will benefit from skilled PT to increase their independence and safety with mobility to allow discharge to the venue listed below.   Pt with 2 recent hospital admissions, may benefit from SNF but does not seem agreeable; resides  in I-living with wife and she has 24hr care; Recommending  HHPT at this time, unsure if caregivers available to assist pt at all (?)--pt denies that he needs their help; will follow      Follow Up Recommendations Home health PT;Supervision/Assistance - 24 hour (vs SNF but  pt states he doesn't want rehab )    Equipment Recommendations       Recommendations for Other Services       Precautions / Restrictions Precautions Precautions: Fall      Mobility  Bed Mobility Overal bed mobility: Needs Assistance Bed Mobility: Supine to Sit     Supine to sit: Min guard     General bed mobility comments: use of rail, HOB elevated 20*; min/guard to facilitate LEs off of bed and brign trunk to upright  Transfers Overall transfer level: Needs assistance Equipment used: None Transfers: Sit to/from Stand Sit to Stand: Min assist         General transfer comment: 2 attempts to come to stand; assist to rise  and stabilize as well as to control descent; multi-modal cues for hand placement and safety awareness  Ambulation/Gait             General Gait Details: pt declined d/t fatigue  Stairs            Wheelchair Mobility    Modified Rankin (Stroke Patients Only)       Balance Overall balance assessment: Needs assistance   Sitting balance-Leahy Scale: Fair       Standing balance-Leahy Scale: Fair Standing balance comment: able to maintain brief static stand wtihout UE support                             Pertinent Vitals/Pain      Home Living Family/patient expects to be discharged to:: Private residence Living Arrangements: Spouse/significant other Available Help at Discharge: Available 24 hours/day;Personal care attendant Type of Home: Independent living facility Home Access: Elevator     Home Layout: One level Home Equipment: Cane - single point;Walker - 2 wheels;Grab bars - tub/shower;Grab bars - toilet;Walker - 4 wheels Additional Comments: pt resides at [facility] and reports he was getting HHPT prior to this adm;  His wife has 24/7 caregivers per pt report    Prior Function Level of Independence: Independent with assistive device(s)         Comments: Normally uses cane, but will use RW for longer distances; pt reports he has difficulty feeding selfd/t right shoulder  RC tear/limited AROM     Hand Dominance        Extremity/Trunk Assessment   Upper Extremity Assessment: Defer to OT evaluation;RUE deficits/detail RUE Deficits / Details: difficulty with active R shoulder forward flexion and abduction;          Lower Extremity Assessment: Generalized weakness         Communication   Communication: No difficulties  Cognition Arousal/Alertness: Awake/alert Behavior During Therapy: WFL for tasks assessed/performed Overall Cognitive Status: No family/caregiver present to determine baseline cognitive functioning Area of  Impairment: Safety/judgement         Safety/Judgement: Decreased awareness of deficits;Decreased awareness of safety     General Comments: chart states hx of dementia; pt asking what day of the week, what time and date was, pt oriented to self and place but repeatedly states he wants to go home today; decr recall of events earlier in day (denies recall of seeing MD and does not remeber PT coming by when he was eating lunch)    General Comments      Exercises        Assessment/Plan    PT Assessment Patient needs continued PT services  PT Diagnosis Difficulty walking;Generalized weakness   PT Problem List Decreased activity tolerance;Decreased balance;Decreased mobility;Decreased safety awareness  PT Treatment Interventions Gait training;Functional mobility training;Therapeutic activities;Therapeutic exercise;DME instruction;Patient/family education   PT Goals (Current goals can be found in the Care Plan section) Acute Rehab PT Goals Patient Stated Goal: go home PT Goal Formulation: With patient Time For Goal Achievement: 11/26/14 Potential to Achieve Goals: Good    Frequency Min 3X/week   Barriers to discharge        Co-evaluation               End of Session   Activity Tolerance: Patient limited by fatigue Patient left: in bed;with call bell/phone within reach;with bed alarm set           Time: [id number] PT Time Calculation (min) (ACUTE ONLY): 15 min   Charges:   PT Evaluation $Initial PT Evaluation Tier I: 1 Procedure     PT G CodesDrucilla Chalet:        Jaionna Weisse 11/19/2014, 3:12 PM

## 2014-11-19 NOTE — Progress Notes (Signed)
ANTICOAGULATION CONSULT NOTE - Initial Consult  Pharmacy Consult for warfarin Indication:AFib  No Known Allergies  Patient Measurements: Height: 5\' 7"  (170.2 cm) Weight: 160 lb (72.576 kg) IBW/kg (Calculated) : 66.1   Vital Signs: Temp: 98.2 F (36.8 C) (11/05 1316) Temp Source: Oral (11/05 1316) BP: 131/90 mmHg (11/05 1316) Pulse Rate: 85 (11/05 1316)  Labs:  Recent Labs  11/17/14 0500 11/18/14 0508  HGB 11.4* 11.8*  HCT 33.5* 35.5*  PLT 187 202  LABPROT 17.6*  --   INR 1.44  --   CREATININE 1.03 0.94    Estimated Creatinine Clearance: 43.9 mL/min (by C-G formula based on Cr of 0.94).   Medical History: Past Medical History  Diagnosis Date  . Atrial fibrillation (HCC)   . S/P CABG x 1   . DVT (deep venous thrombosis) (HCC)   . Hypertension   . Hypothyroid   . Macular degeneration     Medications:  Scheduled:  . aspirin EC  81 mg Oral Daily  . atorvastatin  20 mg Oral q1800  . enoxaparin (LOVENOX) injection  40 mg Subcutaneous Q24H  . levothyroxine  25 mcg Oral QAC breakfast  . metoprolol tartrate  50 mg Oral BID    Assessment: 89 yoM admitted on 11/2 with SBO, but no perforation. PMH is significant for Afib and DVT on chronic warfarin anticoagulation. Warfarin was held on admission and INR reversed with Vitamin K for possible NG tube placement. SBO resolved and pt. Refuses any surgical intervention.  Home dose warfarin 5mg  daily.  11/19/2014  INR 1.44 (11/3) H/H low but stable Plts WNL Pt tolerating clears  Goal of Therapy:  INR 2-3   Plan:  Warfarin 2.5mg  x1 tonight Continue enoxaparin til INR >2 Daily INR   Arley Phenixllen Jackson RPh 11/19/2014, 2:32 PM Pager [phone]

## 2014-11-20 ENCOUNTER — Encounter (HOSPITAL_COMMUNITY): Payer: Self-pay | Admitting: Internal Medicine

## 2014-11-20 DIAGNOSIS — R5381 Other malaise: Secondary | ICD-10-CM | POA: Diagnosis present

## 2014-11-20 DIAGNOSIS — I4891 Unspecified atrial fibrillation: Secondary | ICD-10-CM

## 2014-11-20 LAB — PROTIME-INR
INR: 1.27 (ref 0.00–1.49)
PROTHROMBIN TIME: 16 s — AB (ref 11.6–15.2)

## 2014-11-20 MED ORDER — METOPROLOL TARTRATE 50 MG PO TABS: 50.0000 mg | ORAL_TABLET | Freq: Two times a day (BID) | ORAL | Status: AC

## 2014-11-20 MED ORDER — DOCUSATE SODIUM 100 MG PO CAPS: 100.0000 mg | ORAL_CAPSULE | Freq: Two times a day (BID) | ORAL | Status: AC

## 2014-11-20 NOTE — Discharge Summary (Signed)
Physician Discharge Summary  Edward Fitzpatrick ZOX:[id number] DOB: 01-May-1916 DOA: 11/16/2014  PCP: Bill Salinas I, NP  Admit date: 11/16/2014 Discharge date: 11/20/2014   Recommendations for Outpatient Follow-Up:   1. Resume home health PT. 2. Recommend close follow up of PT/INR until therapeutic. 3. Recommend soft/pureed diet for 1-2 weeks with slow advancement to regular foods.   Discharge Diagnosis:   Principal Problem:    Small bowel obstruction (HCC) Active Problems:    Atrial fibrillation (HCC)    Hypothyroidism    Hypokalemia    Elevated lipase    Diarrhea    Physical deconditioning   Discharge disposition:  River Glass blower/designer.  Discharge Condition: Improved.  Diet recommendation: Soft/pureed.  Wound care: None.   History of Present Illness:   Edward Fitzpatrick is an 81 y.o. male with a PMH of atrial fibrillation on chronic Coumadin, CAD status post CABG, DVT, hypertension, hypothyroidism, and recent hospitalization from 11/10/14-11/14/14 for treatment of SBO which appeared to have resolved by discharge, who was readmitted 11/16/14 with a chief complaint of a 2 day history of 4-5 loose stools/day and inability to eat. Abdominal films done in the ED showed findings consistent with mid to distal SBO.  Hospital Course by Problem:   Principal Problem:  Small bowel obstruction (HCC) / diarrhea - Being followed by surgery with ongoing recommendations for conservative care. Stools for C. Diff negative. - Follow-up abdominal films done 11/17/14 showed improvement and films done 11/20/14 show further improvement. - Recommend soft/pureed diet with close follow up (patient adamant about being discharged today).  Active Problems:   Physical deconditioning - PT evaluation done.  Resume home PT.   Coronary artery disease - Continue aspirin, statin and beta blocker.   Atrial fibrillation (HCC) - Coumadin and metoprolol resumed  11/18/14.   Hypothyroidism - Continue Synthroid.   Hypokalemia - Repleted.   Elevated lipase - Hydrated. Likely from dehydration/SBO.  No evidence of pancreatitis.   Medical Consultants:    Dr. Chevis Pretty III, Surgery   Discharge Exam:   Filed Vitals:   11/20/14 0509  BP: 132/82  Pulse: 101  Temp: 97.4 F (36.3 C)  Resp: 16   Filed Vitals:   11/19/14 0430 11/19/14 1316 11/19/14 2058 11/20/14 0509  BP: 120/88 131/90 139/76 132/82  Pulse: 77 85 55 101  Temp: 98.2 F (36.8 C) 98.2 F (36.8 C) 97.8 F (36.6 C) 97.4 F (36.3 C)  TempSrc: Oral Oral Oral Oral  Resp: Height:      Weight:      SpO2: 99% 100% 100% 100%    Gen:  NAD Cardiovascular:  RRR, No M/R/G Respiratory: Lungs CTAB Gastrointestinal: Abdomen soft, NT/ND with normal active bowel sounds. Extremities: No C/E/C   The results of significant diagnostics from this hospitalization (including imaging, microbiology, ancillary and laboratory) are listed below for reference.     Procedures and Diagnostic Studies:   Dg Abd 1 View  11/16/2014  CLINICAL DATA:  Reason small-bowel obstruction. Diarrhea and nausea. EXAM: ABDOMEN - 1 VIEW COMPARISON:  11/11/2014 abdominal radiograph FINDINGS: Markedly distended small bowel loops throughout the mid and upper abdomen up to 6.6 cm diameter, with interval worsening. There is a relative paucity of gas in the colon. No evidence of pneumatosis or pneumoperitoneum. Vascular calcifications are seen throughout the soft tissues. Curvilinear opacity is seen at the lateral left lung base. Visualized median sternotomy wires appear aligned and intact. Degenerative changes throughout visualized thoracolumbar spine. IMPRESSION: Markedly  distended small bowel loops throughout the mid and upper abdomen with interval worsening. Relative paucity of colonic gas. Findings suggest a worsening mid to distal small bowel obstruction. Correlate with CT abdomen/ pelvis as clinically  warranted. Electronically Signed   By: Delbert Phenix M.D.   On: 11/16/2014 15:01   Dg Abd 2 Views  11/17/2014  CLINICAL DATA:  Follow-up of small bowel obstruction, improving symptoms. EXAM: ABDOMEN - 2 VIEW COMPARISON:  Abdominal series of February 15, 2014 FINDINGS: There remain loops of mildly distended gas-filled small bowel in the midline. The degree of distention has slightly decreased. There is colonic gas and stool exhibiting a normal pattern. Somewhat more rectal gas is observed. IMPRESSION: Findings consistent with a partial mid to distal small bowel obstruction. Some improvement since yesterday's study has occurred. There is no evidence of perforation. Electronically Signed   By: David  [place] M.D.   On: 11/17/2014 09:32     Labs:   Basic Metabolic Panel:  Recent Labs Lab 11/14/14 0437 11/16/14 1342 11/17/14 0500 11/18/14 0508  NA 140 140 140 141  K 3.8 3.1* 3.2* 4.2  CL 109 115* 112* 112*  CO2 22 18* 22 21*  GLUCOSE 150* 99 82 90  BUN 33* 47* 37* 24*  CREATININE 1.03 1.40* 1.03 0.94  CALCIUM 8.4* 8.1* 7.6* 7.8*  MG  --   --  1.7  --    GFR Estimated Creatinine Clearance: 43.9 mL/min (by C-G formula based on Cr of 0.94). Liver Function Tests:  Recent Labs Lab 11/16/14 1342 11/17/14 0500 11/18/14 0508  AST 28 28 28   ALT 24 23 26   ALKPHOS 51 49 52  BILITOT 1.3* 1.4* 1.5*  PROT 5.4* 4.9* 5.0*  ALBUMIN 2.6* 2.5* 2.5*    Recent Labs Lab 11/16/14 1342  LIPASE 62*   Coagulation profile  Recent Labs Lab 11/14/14 0955 11/16/14 1347 11/17/14 0500 11/20/14 0533  INR 2.07* 3.73* 1.44 1.27    CBC:  Recent Labs Lab 11/16/14 1342 11/17/14 0500 11/18/14 0508  WBC 11.0* 8.6 9.7  HGB 13.0 11.4* 11.8*  HCT 38.0* 33.5* 35.5*  MCV 91.1 92.0 92.9  PLT 201 187 202   CBG:  Recent Labs Lab 11/16/14 1339 11/17/14 2116  GLUCAP 96 82   Microbiology Recent Results (from the past 240 hour(s))  C difficile quick scan w PCR reflex     Status: None    Collection Time: 11/16/14  6:00 PM  Result Value Ref Range Status   C Diff antigen NEGATIVE NEGATIVE Final   C Diff toxin NEGATIVE NEGATIVE Final   C Diff interpretation Negative for toxigenic C. difficile  Final     Discharge Instructions:   Discharge Instructions    Call MD for:  extreme fatigue    Complete by:  As directed      Call MD for:  persistant nausea and vomiting    Complete by:  As directed      Call MD for:  severe uncontrolled pain    Complete by:  As directed      Diet - low sodium heart healthy    Complete by:  As directed      Face-to-face encounter (required for Medicare/Medicaid patients)    Complete by:  As directed   I Miia Blanks certify that this patient is under my care and that I, or a nurse practitioner or physician's assistant working with me, had a face-to-face encounter that meets the physician face-to-face encounter requirements with this patient on 11/20/2014.  The encounter with the patient was in whole, or in part for the following medical condition(s) which is the primary reason for home health care (List medical condition): Deconditioning post hospitalization.  Resume services.  The encounter with the patient was in whole, or in part, for the following medical condition, which is the primary reason for home health care:  SBO, deconditioning  I certify that, based on my findings, the following services are medically necessary home health services:  Physical therapy  Reason for Medically Necessary Home Health Services:  Therapy- Therapeutic Exercises to Increase Strength and Endurance  My clinical findings support the need for the above services:  Unable to leave home safely without assistance and/or assistive device  Further, I certify that my clinical findings support that this patient is homebound due to:  Unable to leave home safely without assistance     Home Health    Complete by:  As directed   To provide the following care/treatments:  PT      Increase activity slowly    Complete by:  As directed      Walk with assistance    Complete by:  As directed      Walker     Complete by:  As directed             Medication List    STOP taking these medications        amLODipine 2.5 MG tablet  Commonly known as:  NORVASC      TAKE these medications        acetaminophen 500 MG tablet  Commonly known as:  TYLENOL  Take 1,000 mg by mouth every 6 (six) hours as needed for mild pain or headache.     aspirin EC 81 MG tablet  Take 81 mg by mouth daily.     atorvastatin 20 MG tablet  Commonly known as:  LIPITOR  Take 20 mg by mouth daily.     Cholecalciferol 1000 UNITS capsule  Take 2,000 Units by mouth daily.     cyanocobalamin 2000 MCG tablet  Take 2,000 mcg by mouth daily.     donepezil 5 MG tablet  Commonly known as:  ARICEPT  Take 5 mg by mouth daily at 6 PM.     fluticasone 50 MCG/ACT nasal spray  Commonly known as:  FLONASE  Place 1 spray into both nostrils 2 (two) times daily.     levothyroxine 25 MCG tablet  Commonly known as:  SYNTHROID, LEVOTHROID  Take 25 mcg by mouth daily before breakfast.     losartan-hydrochlorothiazide 100-12.5 MG tablet  Commonly known as:  HYZAAR  Take 1 tablet by mouth daily.     metoprolol 50 MG tablet  Commonly known as:  LOPRESSOR  Take 1 tablet (50 mg total) by mouth 2 (two) times daily.     multivitamin-lutein Caps capsule  Take 1 capsule by mouth daily.     omeprazole 40 MG capsule  Commonly known as:  PRILOSEC  Take 40 mg by mouth daily.     OVER THE COUNTER MEDICATION  Eye wash, uses as needed for dry eyes     ranitidine 150 MG tablet  Commonly known as:  ZANTAC  Take 150 mg by mouth daily.     sodium chloride 0.65 % Soln nasal spray  Commonly known as:  OCEAN  Place 1 spray into both nostrils as needed for congestion.     ULTRAM 50 MG tablet  Generic drug:  traMADol  Take 50 mg by mouth daily as needed for moderate pain.     warfarin 5 MG tablet   Commonly known as:  COUMADIN  Take 5 mg by mouth daily at 6 PM.           Follow-up Information    Follow up with Bill Salinas I, NP. Schedule an appointment as soon as possible for a visit in 1 week.   Specialty:  Nurse Practitioner   Why:  Hospital follow up, and to check blood work (PT/INR)   Contact information:   [address] [place] [address] [phone]        Time coordinating discharge: 35 minutes.  Signed:  Darrow Barreiro  Pager [phone] [facility] 11/20/2014, 9:15 AM

## 2014-11-20 NOTE — Progress Notes (Signed)
[facility]  [address] [place]  [place], [place] [address] Phone: [phone] FAX: [phone]   Edward Fitzpatrick [id number] [birth year]   Problem List:   Principal Problem:   Small bowel obstruction [facility]) Active Problems:   Atrial fibrillation (HCC)   Hypothyroidism   Hypokalemia   Elevated lipase   Diarrhea      * No surgery found *    Assessment  PSBO resolving  Plan:  -agree w purred/Full liquid diet for next few weeks.  OR if recurs - still very much wishes to avoid - "I've got to get home and take care of my wife.  She had a bad stroke in the past"  -VTE prophylaxis- SCDs, etc -mobilize as tolerated to help recovery -prob home soon  Edward Sportsman, M.D., F.A.C.S. Gastrointestinal and Minimally Invasive Surgery [facility] [address] [place], [place] [address] [phone] Main / Paging   11/20/2014  Subjective:  Feeling better Wants to go home Edward Fitzpatrick [facility] primary at bedside  Objective:  Vital signs:  Filed Vitals:   11/19/14 0430 11/19/14 1316 11/19/14 2058 11/20/14 0509  BP: 120/88 131/90 139/76 132/82  Pulse: 77 85 55 101  Temp: 98.2 F (36.8 C) 98.2 F (36.8 C) 97.8 F (36.6 C) 97.4 F (36.3 C)  TempSrc: Oral Oral Oral Oral  Resp: Height:      Weight:      SpO2: 99% 100% 100% 100%    Last BM Date: 11/19/14  Intake/Output   Yesterday:  11/05 0701 - 11/06 0700 In: 840 [P.O.:840] Out: -  This shift:  Total I/O In: 240 [P.O.:240] Out: -   Bowel function:  Flatus: y  BM: y  Drain: n/a  Physical Exam:  General: Pt awake/alert/oriented x4 in no acute distress Eyes: PERRL, normal EOM.  Sclera clear.  No icterus Neuro: CN II-XII intact w/o focal sensory/motor deficits. Lymph: No head/neck/groin lymphadenopathy Psych:  No delerium/psychosis/paranoia HENT: Normocephalic, Mucus membranes moist.  No thrush Neck: Supple, No tracheal  deviation Chest: No chest wall pain w good excursion CV:  Pulses intact.  Regular rhythm MS: Normal AROM mjr joints.  No obvious deformity Abdomen: Soft.  Mildly distended.  Nontender.  No evidence of peritonitis.  No incarcerated hernias. Ext:  SCDs BLE.  No mjr edema.  No cyanosis Skin: No petechiae / purpura  Results:   Labs: Results for orders placed or performed during the hospital encounter of 11/16/14 (from the past 48 hour(s))  Protime-INR     Status: Abnormal   Collection Time: 11/20/14  5:33 AM  Result Value Ref Range   Prothrombin Time 16.0 (H) 11.6 - 15.2 seconds   INR 1.27 0.00 - 1.49    Imaging / Studies: Dg Abd 2 Views  11/19/2014  CLINICAL DATA:  Abdominal pain.  Follow-up small bowel obstruction. EXAM: ABDOMEN - 2 VIEW COMPARISON:  Yesterday. FINDINGS: Previously demonstrated dilated small bowel loops in the mid abdomen are smaller with increased gas in normal caliber colon. No free peritoneal air. Extensive lumbar spine degenerative changes. Arterial calcifications. IMPRESSION: Improved pattern of partial small bowel obstruction with mild residual partial obstruction. Electronically Signed   By: Beckie Salts M.D.   On: 11/19/2014 11:10    Medications / Allergies: per chart  Antibiotics: Anti-infectives    None        Note: Portions of this report may have been transcribed using voice recognition software. Every effort  was made to ensure accuracy; however, inadvertent computerized transcription errors may be present.   Any transcriptional errors that result from this process are unintentional.     Edward Fitzpatrick, M.D., F.A.C.S. Gastrointestinal and Minimally Invasive Surgery [facility] [address] [place], [place] [address] [phone] Main / Paging   11/20/2014  CARE TEAM:  PCP: Vernon Preyurbyfill, Holly I, NP  Outpatient Care Team: Patient Care Team: Vernon PreyHolly I Turbyfill, NP as PCP - General (Nurse  Practitioner)  Inpatient Treatment Team: Treatment Team: Attending Provider: Maryruth Bunhristina P Rama, MD; Consulting Physician: Bishop LimboMd Ccs, MD; Technician: Pamala Hurryourtney P Shore, NT; Rounding Team: Massie KluverWl3 [facility], MD; Social Worker: Buck MamJanet P Caldwell, LCSW; Registered Nurse: Rosine DoorSamantha W Seagraves, RN; Technician: Nancy MarusJulie A Bryant; Registered Nurse: Alanda SlimKatelyn G Dunkelberger, RN; Technician: Guinevere FerrariJamie L Snead, NT; Technician: Darl PikesMarissa N Campbell, NT; Registered Nurse: Oda KiltsNatashia A Broadnax, RN; Registered Nurse: Aliene BeamsBrooke M Shaw, RN

## 2016-02-07 ENCOUNTER — Emergency Department (HOSPITAL_BASED_OUTPATIENT_CLINIC_OR_DEPARTMENT_OTHER)
Admission: EM | Admit: 2016-02-07 | Discharge: 2016-02-07 | Disposition: A | Discharge status: Home / Self Care | Payer: Medicare HMO | Attending: Emergency Medicine | Admitting: Emergency Medicine

## 2016-02-07 ENCOUNTER — Encounter (HOSPITAL_BASED_OUTPATIENT_CLINIC_OR_DEPARTMENT_OTHER): Payer: Self-pay | Admitting: *Deleted

## 2016-02-07 ENCOUNTER — Emergency Department (HOSPITAL_BASED_OUTPATIENT_CLINIC_OR_DEPARTMENT_OTHER): Payer: Medicare HMO

## 2016-02-07 DIAGNOSIS — E039 Hypothyroidism, unspecified: Secondary | ICD-10-CM | POA: Diagnosis not present

## 2016-02-07 DIAGNOSIS — R0602 Shortness of breath: Secondary | ICD-10-CM | POA: Diagnosis present

## 2016-02-07 DIAGNOSIS — I10 Essential (primary) hypertension: Secondary | ICD-10-CM | POA: Insufficient documentation

## 2016-02-07 DIAGNOSIS — Z87891 Personal history of nicotine dependence: Secondary | ICD-10-CM | POA: Diagnosis not present

## 2016-02-07 DIAGNOSIS — Z79899 Other long term (current) drug therapy: Secondary | ICD-10-CM | POA: Diagnosis not present

## 2016-02-07 DIAGNOSIS — J9801 Acute bronchospasm: Secondary | ICD-10-CM | POA: Insufficient documentation

## 2016-02-07 DIAGNOSIS — Z7982 Long term (current) use of aspirin: Secondary | ICD-10-CM | POA: Insufficient documentation

## 2016-02-07 LAB — COMPREHENSIVE METABOLIC PANEL
ALT: 19 U/L (ref 17–63)
ANION GAP: 11 (ref 5–15)
AST: 31 U/L (ref 15–41)
Albumin: 4.5 g/dL (ref 3.5–5.0)
Alkaline Phosphatase: 78 U/L (ref 38–126)
BUN: 30 mg/dL — AB (ref 6–20)
CHLORIDE: 97 mmol/L — AB (ref 101–111)
CO2: 26 mmol/L (ref 22–32)
Calcium: 9 mg/dL (ref 8.9–10.3)
Creatinine, Ser: 1.48 mg/dL — ABNORMAL HIGH (ref 0.61–1.24)
GFR, EST AFRICAN AMERICAN: 44 mL/min — AB (ref >60)
GFR, EST NON AFRICAN AMERICAN: 38 mL/min — AB (ref >60)
Glucose, Bld: 101 mg/dL — ABNORMAL HIGH (ref 65–99)
POTASSIUM: 3.8 mmol/L (ref 3.5–5.1)
Sodium: 134 mmol/L — ABNORMAL LOW (ref 135–145)
TOTAL PROTEIN: 8 g/dL (ref 6.5–8.1)
Total Bilirubin: 1.4 mg/dL — ABNORMAL HIGH (ref 0.3–1.2)

## 2016-02-07 LAB — I-STAT CG4 LACTIC ACID, ED: LACTIC ACID, VENOUS: 1.65 mmol/L (ref 0.5–1.9)

## 2016-02-07 LAB — CBC WITH DIFFERENTIAL/PLATELET
BASOS ABS: 0 10*3/uL (ref 0.0–0.1)
Basophils Relative: 0 %
EOS PCT: 1 %
Eosinophils Absolute: 0 10*3/uL (ref 0.0–0.7)
HCT: 44.6 % (ref 39.0–52.0)
Hemoglobin: 15.2 g/dL (ref 13.0–17.0)
LYMPHS PCT: 18 %
Lymphs Abs: 1.2 10*3/uL (ref 0.7–4.0)
MCH: 31.4 pg (ref 26.0–34.0)
MCHC: 34.1 g/dL (ref 30.0–36.0)
MCV: 92.1 fL (ref 78.0–100.0)
MONO ABS: 0.9 10*3/uL (ref 0.1–1.0)
MONOS PCT: 14 %
Neutro Abs: 4.5 10*3/uL (ref 1.7–7.7)
Neutrophils Relative %: 67 %
PLATELETS: 211 10*3/uL (ref 150–400)
RBC: 4.84 MIL/uL (ref 4.22–5.81)
RDW: 14.6 % (ref 11.5–15.5)
WBC: 6.6 10*3/uL (ref 4.0–10.5)

## 2016-02-07 LAB — TROPONIN I: TROPONIN I: 0.03 ng/mL — AB (ref <0.03)

## 2016-02-07 LAB — BRAIN NATRIURETIC PEPTIDE: B NATRIURETIC PEPTIDE 5: 166 pg/mL — AB (ref 0.0–100.0)

## 2016-02-07 MED ORDER — LEVALBUTEROL HCL 0.63 MG/3ML IN NEBU
0.6300 mg | INHALATION_SOLUTION | Freq: Once | RESPIRATORY_TRACT | Status: AC
  Administered 2016-02-07: 0.63 mg via RESPIRATORY_TRACT
  Filled 2016-02-07: qty 3

## 2016-02-07 MED ORDER — ALBUTEROL SULFATE HFA 108 (90 BASE) MCG/ACT IN AERS: 1.0000 | INHALATION_SPRAY | Freq: Four times a day (QID) | RESPIRATORY_TRACT | 0 refills | Status: AC | PRN

## 2016-02-07 MED ORDER — METHYLPREDNISOLONE SODIUM SUCC 125 MG IJ SOLR
125.0000 mg | Freq: Once | INTRAMUSCULAR | Status: AC
  Administered 2016-02-07: 125 mg via INTRAVENOUS
  Filled 2016-02-07: qty 2

## 2016-02-07 MED ORDER — PREDNISONE 20 MG PO TABS: ORAL_TABLET | ORAL | 0 refills | Status: AC

## 2016-02-07 NOTE — ED Notes (Signed)
Patient transported to X-ray 

## 2016-02-07 NOTE — ED Notes (Signed)
ED Provider at bedside. 

## 2016-02-07 NOTE — ED Triage Notes (Signed)
Pt caregiver reports pt with cough and congestion x 1week, seen by his pcp and rx z pack and mucinex, sob increased today with generalized weakness.

## 2016-02-07 NOTE — ED Provider Notes (Signed)
[facility] Provider Note   CSN: [id number] Arrival date & time: 02/07/16  1512     History   Chief Complaint Chief Complaint  Patient presents with  . Shortness of Breath    HPI Edward Fitzpatrick is a 83 y.o. male.  HPI Patient recently treated for upper respiratory tract infection. Has finished a Z-Pak. States had bilateral chest congestion and cough but no sputum production. Has had increase in shortness of breath worse with exertion. Concern of some "fluid around the heart" on x-ray at that make your doctor's office. Sent to emergency department for evaluation. Patient denies any chest pain. Denies any new lower extremity swelling.  Past Medical History:  Diagnosis Date  . Atrial fibrillation (HCC)   . DVT (deep venous thrombosis) (HCC)   . Hypertension   . Hypothyroid   . Macular degeneration   . S/P CABG x 1   . Small bowel obstruction 11/10/2014    Patient Active Problem List   Diagnosis Date Noted  . Physical deconditioning 11/20/2014  . Small bowel obstruction 11/10/2014  . HTN (hypertension) 11/10/2014  . Atrial fibrillation (HCC) 11/10/2014  . Hypothyroidism 11/10/2014    Past Surgical History:  Procedure Laterality Date  . APPENDECTOMY    . CORONARY ARTERY BYPASS GRAFT         Home Medications    Prior to Admission medications   Medication Sig Start Date End Date Taking? Authorizing Provider  acetaminophen (TYLENOL) 500 MG tablet Take 1,000 mg by mouth every 6 (six) hours as needed for mild pain or headache.    Historical Provider, MD  albuterol (PROVENTIL HFA;VENTOLIN HFA) 108 (90 Base) MCG/ACT inhaler Inhale 1-2 puffs into the lungs every 6 (six) hours as needed for wheezing or shortness of breath. 02/07/16   Loren Racer, MD  aspirin EC 81 MG tablet Take 81 mg by mouth daily.    Historical Provider, MD  atorvastatin (LIPITOR) 20 MG tablet Take 20 mg by mouth daily.    Historical Provider, MD  Cholecalciferol 1000 UNITS capsule Take  2,000 Units by mouth daily.  02/14/14   Historical Provider, MD  cyanocobalamin 2000 MCG tablet Take 2,000 mcg by mouth daily.    Historical Provider, MD  docusate sodium (COLACE) 100 MG capsule Take 1 capsule (100 mg total) by mouth 2 (two) times daily. 11/20/14   Maryruth Bun Rama, MD  donepezil (ARICEPT) 5 MG tablet Take 5 mg by mouth daily at 6 PM. 05/02/14   Historical Provider, MD  fluticasone (FLONASE) 50 MCG/ACT nasal spray Place 1 spray into both nostrils 2 (two) times daily. 10/11/14   Historical Provider, MD  levothyroxine (SYNTHROID, LEVOTHROID) 25 MCG tablet Take 25 mcg by mouth daily before breakfast.    Historical Provider, MD  losartan-hydrochlorothiazide (HYZAAR) 100-12.5 MG tablet Take 1 tablet by mouth daily.    Historical Provider, MD  metoprolol (LOPRESSOR) 50 MG tablet Take 1 tablet (50 mg total) by mouth 2 (two) times daily. 11/20/14   Maryruth Bun Rama, MD  multivitamin-lutein (OCUVITE-LUTEIN) CAPS capsule Take 1 capsule by mouth daily.    Historical Provider, MD  omeprazole (PRILOSEC) 40 MG capsule Take 40 mg by mouth daily.    Historical Provider, MD  OVER THE COUNTER MEDICATION Eye wash, uses as needed for dry eyes    Historical Provider, MD  predniSONE (DELTASONE) 20 MG tablet 3 tabs po day one, then 2 po daily x 4 days 02/07/16   Loren Racer, MD  ranitidine (ZANTAC) 150 MG tablet  Take 150 mg by mouth daily. 10/08/14   Historical Provider, MD  sodium chloride (OCEAN) 0.65 % SOLN nasal spray Place 1 spray into both nostrils as needed for congestion.    Historical Provider, MD    Family History History reviewed. No pertinent family history.  Social History Social History  Substance Use Topics  . Smoking status: Former Games developer  . Smokeless tobacco: Never Used  . Alcohol use Yes     Comment: occasional      Allergies   Patient has no known allergies.   Review of Systems Review of Systems  Constitutional: Negative for chills and fever.  HENT: Positive for  congestion. Negative for sore throat.   Eyes: Negative for visual disturbance.  Respiratory: Positive for cough, chest tightness and shortness of breath.   Cardiovascular: Negative for chest pain, palpitations and leg swelling.  Gastrointestinal: Negative for abdominal pain, constipation, diarrhea, nausea and vomiting.  Genitourinary: Negative for dysuria, flank pain and frequency.  Musculoskeletal: Negative for arthralgias, back pain, joint swelling, myalgias and neck pain.  Skin: Negative for rash and wound.  Neurological: Negative for dizziness, weakness, light-headedness, numbness and headaches.  All other systems reviewed and are negative.    Physical Exam Updated Vital Signs BP 153/90 (BP Location: Right Arm)   Pulse 99   Temp 98.1 F (36.7 C) (Oral)   Resp 20   Ht 5\' 7"  (1.702 m)   Wt 165 lb (74.8 kg)   SpO2 95%   BMI 25.84 kg/m   Physical Exam  Constitutional: He is oriented to person, place, and time. He appears well-developed and well-nourished. No distress.  HENT:  Head: Normocephalic and atraumatic.  Mouth/Throat: Oropharynx is clear and moist. No oropharyngeal exudate.  Eyes: EOM are normal. Pupils are equal, round, and reactive to light.  Neck: Normal range of motion. Neck supple.  Cardiovascular: Exam reveals no gallop and no friction rub.   Murmur heard. Tachycardia. Irregularly irregular.  Pulmonary/Chest: Effort normal. No stridor. He has wheezes. He has rales.  Rales in bilateral bases. Patient has diffuse expiratory wheezing. No respiratory distress.  Abdominal: Soft. Bowel sounds are normal. There is no tenderness. There is no rebound and no guarding.  Musculoskeletal: Normal range of motion. He exhibits no edema or tenderness.  No lower extremity swelling, asymmetry or tenderness.  Neurological: He is alert and oriented to person, place, and time.  Moves all extremities without deficit. Sensation fully intact.  Skin: Skin is warm and dry. Capillary  refill takes less than 2 seconds. No rash noted. No erythema.  Psychiatric: He has a normal mood and affect. His behavior is normal.  Nursing note and vitals reviewed.    ED Treatments / Results  Labs (all labs ordered are listed, but only abnormal results are displayed) Labs Reviewed  COMPREHENSIVE METABOLIC PANEL - Abnormal; Notable for the following:       Result Value   Sodium 134 (*)    Chloride 97 (*)    Glucose, Bld 101 (*)    BUN 30 (*)    Creatinine, Ser 1.48 (*)    Total Bilirubin 1.4 (*)    GFR calc non Af Amer 38 (*)    GFR calc Af Amer 44 (*)    All other components within normal limits  BRAIN NATRIURETIC PEPTIDE - Abnormal; Notable for the following:    B Natriuretic Peptide 166.0 (*)    All other components within normal limits  TROPONIN I - Abnormal; Notable for the following:  Troponin I 0.03 (*)    All other components within normal limits  CBC WITH DIFFERENTIAL/PLATELET  TROPONIN I  I-STAT CG4 LACTIC ACID, ED    EKG  EKG Interpretation  Date/Time:  Wednesday February 07 2016 15:29:39 EST Ventricular Rate:  104 PR Interval:    QRS Duration: 102 QT Interval:  376 QTC Calculation: 442 R Axis:   -59 Text Interpretation:  Atrial fibrillation Ventricular bigeminy Left axis deviation Confirmed by Ranae PalmsYELVERTON  MD, Stephaun Million ([id number]) on 02/07/2016 5:03:22 PM       Radiology Dg Chest 2 View  Result Date: 02/07/2016 CLINICAL DATA:  Shortness of breath. EXAM: CHEST  2 VIEW COMPARISON:  Radiographs of September 03, 2015. FINDINGS: Stable cardiomediastinal silhouette. Sternotomy wires are noted. Atherosclerosis of thoracic aorta is noted. No pneumothorax or pleural effusion is noted. No acute pulmonary disease is noted. Bony thorax is unremarkable. IMPRESSION: No active cardiopulmonary disease.  Aortic atherosclerosis. Electronically Signed   By: Lupita RaiderJames  Green Jr, M.D.   On: 02/07/2016 16:41    Procedures Procedures (including critical care time)  Medications Ordered  in ED Medications  levalbuterol (XOPENEX) nebulizer solution 0.63 mg (0.63 mg Nebulization Given 02/07/16 1630)  methylPREDNISolone sodium succinate (SOLU-MEDROL) 125 mg/2 mL injection 125 mg (125 mg Intravenous Given 02/07/16 1601)     Initial Impression / Assessment and Plan / ED Course  I have reviewed the triage vital signs and the nursing notes.  Pertinent labs & imaging results that were available during my care of the patient were reviewed by me and considered in my medical decision making (see chart for details).    Wheezing has improved after breathing treatment. Maintaining saturations in the high 90s. Patient is anxious to be discharged home. And stance the need to follow-up with his primary physician. Return precautions given. Will discharge home with short course of steroids and MDI.   Final Clinical Impressions(s) / ED Diagnoses   Final diagnoses:  Bronchospasm, acute    New Prescriptions Discharge Medication List as of 02/07/2016  8:20 PM    START taking these medications   Details  albuterol (PROVENTIL HFA;VENTOLIN HFA) 108 (90 Base) MCG/ACT inhaler Inhale 1-2 puffs into the lungs every 6 (six) hours as needed for wheezing or shortness of breath., Starting Wed 02/07/2016, Print    predniSONE (DELTASONE) 20 MG tablet 3 tabs po day one, then 2 po daily x 4 days, Print         Loren Raceravid Mavin Dyke, MD 02/08/16 [phone]

## 2016-02-07 NOTE — ED Notes (Signed)
Patient w/ cough and congestion that started about 1 week ago. Patient was seen at PCP last week, given Z-pack which is now completed. Patient reports a cough, non productive, rhonchi throughout chest. Patient is able to speak in complete sentences and denies SOB.

## 2017-01-14 ENCOUNTER — Other Ambulatory Visit: Payer: Self-pay

## 2017-01-14 ENCOUNTER — Emergency Department (HOSPITAL_BASED_OUTPATIENT_CLINIC_OR_DEPARTMENT_OTHER)
Admission: EM | Admit: 2017-01-14 | Discharge: 2017-01-14 | Disposition: A | Discharge status: Home / Self Care | Payer: Medicare HMO | Attending: Emergency Medicine | Admitting: Emergency Medicine

## 2017-01-14 ENCOUNTER — Encounter (HOSPITAL_BASED_OUTPATIENT_CLINIC_OR_DEPARTMENT_OTHER): Payer: Self-pay | Admitting: Emergency Medicine

## 2017-01-14 DIAGNOSIS — R51 Headache: Secondary | ICD-10-CM | POA: Diagnosis present

## 2017-01-14 DIAGNOSIS — Z951 Presence of aortocoronary bypass graft: Secondary | ICD-10-CM | POA: Diagnosis not present

## 2017-01-14 DIAGNOSIS — Z87891 Personal history of nicotine dependence: Secondary | ICD-10-CM | POA: Insufficient documentation

## 2017-01-14 DIAGNOSIS — Z7901 Long term (current) use of anticoagulants: Secondary | ICD-10-CM | POA: Insufficient documentation

## 2017-01-14 DIAGNOSIS — R519 Headache, unspecified: Secondary | ICD-10-CM

## 2017-01-14 DIAGNOSIS — I10 Essential (primary) hypertension: Secondary | ICD-10-CM | POA: Insufficient documentation

## 2017-01-14 DIAGNOSIS — Z7982 Long term (current) use of aspirin: Secondary | ICD-10-CM | POA: Insufficient documentation

## 2017-01-14 DIAGNOSIS — Z79899 Other long term (current) drug therapy: Secondary | ICD-10-CM | POA: Insufficient documentation

## 2017-01-14 DIAGNOSIS — E039 Hypothyroidism, unspecified: Secondary | ICD-10-CM | POA: Diagnosis not present

## 2017-01-14 MED ORDER — CARBAMIDE PEROXIDE 6.5 % OT SOLN: 5.0000 [drp] | Freq: Two times a day (BID) | OTIC | 5 refills | Status: AC

## 2017-01-14 NOTE — Discharge Instructions (Signed)
Stop using the Flonase and just try saline spray.  If you develop fever, difficulty breathing, dizziness, vision changes, difficulty walking or other concerns please return immediately.

## 2017-01-14 NOTE — ED Triage Notes (Signed)
R side Headache, described as pressure, since yesterday. Denies falling, new visual problems, or dizziness.

## 2017-01-14 NOTE — ED Provider Notes (Signed)
[facility] EMERGENCY DEPARTMENT Provider Note   CSN: [id number] Arrival date & time: 01/14/17  4098     History   Chief Complaint Chief Complaint  Patient presents with  . Headache    HPI Edward Fitzpatrick is a 84 y.o. male.  Patient is a 84 year old male with a history of atrial fibrillation, DVT on Coumadin, hypertension, hyperthyroidism, macular degeneration presenting today with a right-sided headache.  Patient states around 11 PM last night he got a severe pain in his right forehead and eye area which over the evening and to now has moved and he is now only having pressure in the right side of his nose.  He denies any headache at this time or eye pain.  He has not had any vision changes.  He denies any recent fever, cough but has had some congestion in his nose since the facial pain started.  He denies any difficulty walking, unilateral numbness or weakness.  No difficulty with speech.  He did not take any medication for his symptoms.  He does use Flonase bid and has been doing that for some time   The history is provided by the patient.    Past Medical History:  Diagnosis Date  . Atrial fibrillation (HCC)   . DVT (deep venous thrombosis) (HCC)   . Hypertension   . Hypothyroid   . Macular degeneration   . S/P CABG x 1   . Small bowel obstruction (HCC) 11/10/2014    Patient Active Problem List   Diagnosis Date Noted  . Physical deconditioning 11/20/2014  . Small bowel obstruction (HCC) 11/10/2014  . HTN (hypertension) 11/10/2014  . Atrial fibrillation (HCC) 11/10/2014  . Hypothyroidism 11/10/2014    Past Surgical History:  Procedure Laterality Date  . APPENDECTOMY    . CORONARY ARTERY BYPASS GRAFT         Home Medications    Prior to Admission medications   Medication Sig Start Date End Date Taking? Authorizing Provider  fluticasone (FLONASE) 50 MCG/ACT nasal spray Place 1 spray into both nostrils 2 (two) times daily. 10/11/14  Yes [provider]  levothyroxine (SYNTHROID, LEVOTHROID) 25 MCG tablet Take 25 mcg by mouth daily before breakfast.   Yes [provider]  losartan-hydrochlorothiazide (HYZAAR) 100-12.5 MG tablet Take 1 tablet by mouth daily.   Yes [provider]  ranitidine (ZANTAC) 150 MG tablet Take 150 mg by mouth daily. 10/08/14  Yes [provider]  warfarin (COUMADIN) 5 MG tablet Take 5 mg by mouth daily.   Yes [provider]  acetaminophen (TYLENOL) 500 MG tablet Take 1,000 mg by mouth every 6 (six) hours as needed for mild pain or headache.    [provider]  albuterol (PROVENTIL HFA;VENTOLIN HFA) 108 (90 Base) MCG/ACT inhaler Inhale 1-2 puffs into the lungs every 6 (six) hours as needed for wheezing or shortness of breath. 02/07/16   Loren Racer, MD  aspirin EC 81 MG tablet Take 81 mg by mouth daily.    [provider]  atorvastatin (LIPITOR) 20 MG tablet Take 20 mg by mouth daily.    [provider]  Cholecalciferol 1000 UNITS capsule Take 2,000 Units by mouth daily.  02/14/14   [provider]  cyanocobalamin 2000 MCG tablet Take 2,000 mcg by mouth daily.    [provider]  docusate sodium (COLACE) 100 MG capsule Take 1 capsule (100 mg total) by mouth 2 (two) times daily. 11/20/14   Rama, Maryruth Bun, MD  donepezil (  ARICEPT) 5 MG tablet Take 5 mg by mouth daily at 6 PM. 05/02/14   [provider]  metoprolol (LOPRESSOR) 50 MG tablet Take 1 tablet (50 mg total) by mouth 2 (two) times daily. 11/20/14   Rama, Maryruth Bunhristina P, MD  multivitamin-lutein (OCUVITE-LUTEIN) CAPS capsule Take 1 capsule by mouth daily.    [provider]  omeprazole (PRILOSEC) 40 MG capsule Take 40 mg by mouth daily.    [provider]  OVER THE COUNTER MEDICATION Eye wash, uses as needed for dry eyes    [provider]  predniSONE (DELTASONE) 20 MG tablet 3 tabs po day one, then 2 po daily x 4 days 02/07/16   Loren RacerYelverton,  David, MD  sodium chloride (OCEAN) 0.65 % SOLN nasal spray Place 1 spray into both nostrils as needed for congestion.    [provider]    Family History No family history on file.  Social History Social History   Tobacco Use  . Smoking status: Former Games developermoker  . Smokeless tobacco: Never Used  Substance Use Topics  . Alcohol use: Yes    Comment: occasional   . Drug use: No     Allergies   Patient has no known allergies.   Review of Systems Review of Systems  All other systems reviewed and are negative.    Physical Exam Updated Vital Signs BP (!) 165/85 (BP Location: Right Arm)   Pulse 95   Temp (!) 97.5 F (36.4 C) (Oral)   Resp 18   Ht 5\' 10"  (1.778 m)   Wt 78 kg (172 lb)   SpO2 99%   BMI 24.68 kg/m   Physical Exam  Constitutional: He is oriented to person, place, and time. He appears well-developed and well-nourished. No distress.  HENT:  Head: Normocephalic and atraumatic.  Nose: Right sinus exhibits no maxillary sinus tenderness and no frontal sinus tenderness. Left sinus exhibits no maxillary sinus tenderness and no frontal sinus tenderness.  Mouth/Throat: Oropharynx is clear and moist.  Right complete cerumen impaction.  Left ear canal partial obstructed but rest of TM wnl.  No periauricular lymph nodes.  Bilateral nares with ulceration and thinned skin.  No bleeding  Eyes: Conjunctivae and EOM are normal. Pupils are equal, round, and reactive to light.  Neck: Normal range of motion. Neck supple.  Cardiovascular: Normal rate and intact distal pulses. An irregularly irregular rhythm present.  No murmur heard. Pulmonary/Chest: Effort normal and breath sounds normal. No respiratory distress. He has no wheezes. He has no rales.  Musculoskeletal: Normal range of motion. He exhibits no edema or tenderness.  Neurological: He is alert and oriented to person, place, and time. He has normal strength. No cranial nerve deficit or sensory deficit. Coordination  and gait normal.  5/5 strength in bilateral upper and lower ext  Skin: Skin is warm and dry. No rash noted. No erythema.  Psychiatric: He has a normal mood and affect. His behavior is normal.  Nursing note and vitals reviewed.    ED Treatments / Results  Labs (all labs ordered are listed, but only abnormal results are displayed) Labs Reviewed - No data to display  EKG  EKG Interpretation None       Radiology No results found.  Procedures Procedures (including critical care time)  Medications Ordered in ED Medications - No data to display   Initial Impression / Assessment and Plan / ED Course  I have reviewed the triage vital signs and the nursing notes.  Pertinent labs &  imaging results that were available during my care of the patient were reviewed by me and considered in my medical decision making (see chart for details).     Patient presenting with facial pain today that is significantly improved from when it started.  He still having some fullness in the right side of his nose but denies any other symptoms of sinusitis.  He does have cerumen impaction on the right but no strokelike symptoms concerning for stroke or intracranial hemorrhage.  Patient at no time had a true headache is all been in his face.  He has no evidence concerning of zoster.  Vision is unchanged.  Neuro exam is within normal limits.  She denies any trauma or concerned that he would have a head bleed.  He is on Coumadin but is watched closely by his doctors and has had therapeutic INRs. Will remove cerumen but at this time recommended the patient stop using Flonase given the skin changes within his naris.  Also recommended he follow-up with his doctor but this time would only use saline spray but did not feel he needs an antibiotic.  He has no temporal pain concerning for temporal arteritis.  11:39 AM After ear was cleaned patient's TM has some minimal effusion but otherwise normal-appearing. Final  Clinical Impressions(s) / ED Diagnoses   Final diagnoses:  Sinus headache    ED Discharge Orders        Ordered    carbamide peroxide (DEBROX) 6.5 % OTIC solution  2 times daily     01/14/17 1138       Gwyneth Sprout, MD 01/14/17 1140

## 2017-01-14 NOTE — ED Notes (Signed)
ED Provider at bedside. 

## 2020-03-31 ENCOUNTER — Emergency Department (HOSPITAL_COMMUNITY): Payer: Medicare HMO

## 2020-03-31 ENCOUNTER — Other Ambulatory Visit: Payer: Self-pay

## 2020-03-31 ENCOUNTER — Encounter (HOSPITAL_COMMUNITY): Payer: Self-pay | Admitting: *Deleted

## 2020-03-31 ENCOUNTER — Emergency Department (HOSPITAL_COMMUNITY)
Admission: EM | Admit: 2020-03-31 | Discharge: 2020-04-01 | Disposition: A | Discharge status: Home / Self Care | Payer: Medicare HMO | Attending: Emergency Medicine | Admitting: Emergency Medicine

## 2020-03-31 DIAGNOSIS — K5730 Diverticulosis of large intestine without perforation or abscess without bleeding: Secondary | ICD-10-CM | POA: Insufficient documentation

## 2020-03-31 DIAGNOSIS — R519 Headache, unspecified: Secondary | ICD-10-CM | POA: Diagnosis not present

## 2020-03-31 DIAGNOSIS — W1830XA Fall on same level, unspecified, initial encounter: Secondary | ICD-10-CM | POA: Diagnosis not present

## 2020-03-31 DIAGNOSIS — Y9289 Other specified places as the place of occurrence of the external cause: Secondary | ICD-10-CM | POA: Diagnosis not present

## 2020-03-31 DIAGNOSIS — I4891 Unspecified atrial fibrillation: Secondary | ICD-10-CM | POA: Diagnosis not present

## 2020-03-31 DIAGNOSIS — W19XXXA Unspecified fall, initial encounter: Secondary | ICD-10-CM

## 2020-03-31 DIAGNOSIS — Z043 Encounter for examination and observation following other accident: Secondary | ICD-10-CM | POA: Insufficient documentation

## 2020-03-31 DIAGNOSIS — F0390 Unspecified dementia without behavioral disturbance: Secondary | ICD-10-CM | POA: Diagnosis not present

## 2020-03-31 DIAGNOSIS — Z7901 Long term (current) use of anticoagulants: Secondary | ICD-10-CM | POA: Diagnosis not present

## 2020-03-31 DIAGNOSIS — J189 Pneumonia, unspecified organism: Secondary | ICD-10-CM | POA: Diagnosis not present

## 2020-03-31 DIAGNOSIS — Z7722 Contact with and (suspected) exposure to environmental tobacco smoke (acute) (chronic): Secondary | ICD-10-CM | POA: Diagnosis not present

## 2020-03-31 LAB — URINALYSIS, ROUTINE W REFLEX MICROSCOPIC
Bacteria, UA: NONE SEEN
Bilirubin Urine: NEGATIVE
Glucose, UA: NEGATIVE mg/dL
Ketones, ur: NEGATIVE mg/dL
Leukocytes,Ua: NEGATIVE
Nitrite: NEGATIVE
Protein, ur: NEGATIVE mg/dL
Specific Gravity, Urine: 1.004 — ABNORMAL LOW (ref 1.005–1.030)
pH: 7 (ref 5.0–8.0)

## 2020-03-31 LAB — PROTIME-INR
INR: 2.6 — ABNORMAL HIGH (ref 0.8–1.2)
Prothrombin Time: 26.8 seconds — ABNORMAL HIGH (ref 11.4–15.2)

## 2020-03-31 LAB — ETHANOL: Alcohol, Ethyl (B): <10 mg/dL (ref <10)

## 2020-03-31 LAB — TROPONIN I (HIGH SENSITIVITY): Troponin I (High Sensitivity): 28 ng/L — ABNORMAL HIGH (ref <18)

## 2020-03-31 MED ORDER — CEFDINIR 300 MG PO CAPS
300.0000 mg | ORAL_CAPSULE | Freq: Once | ORAL | Status: AC
  Administered 2020-03-31: 300 mg via ORAL
  Filled 2020-03-31: qty 1

## 2020-03-31 MED ORDER — AZITHROMYCIN 250 MG PO TABS: 250.0000 mg | ORAL_TABLET | Freq: Every day | ORAL | 0 refills | Status: AC

## 2020-03-31 MED ORDER — SODIUM CHLORIDE 0.9 % IV SOLN: 1.0000 g | Freq: Once | INTRAVENOUS | Status: DC

## 2020-03-31 MED ORDER — AZITHROMYCIN 250 MG PO TABS
500.0000 mg | ORAL_TABLET | Freq: Once | ORAL | Status: AC
  Administered 2020-03-31: 500 mg via ORAL
  Filled 2020-03-31: qty 2

## 2020-03-31 MED ORDER — SODIUM CHLORIDE 0.9 % IV SOLN: 500.0000 mg | Freq: Once | INTRAVENOUS | Status: DC

## 2020-03-31 MED ORDER — CEFPODOXIME PROXETIL 200 MG PO TABS
200.0000 mg | ORAL_TABLET | Freq: Two times a day (BID) | ORAL | 0 refills | Status: AC
Start: 2020-03-31 — End: 2020-04-07

## 2020-03-31 MED ORDER — IOHEXOL 300 MG/ML  SOLN
100.0000 mL | Freq: Once | INTRAMUSCULAR | Status: AC | PRN
  Administered 2020-03-31: 100 mL via INTRAVENOUS

## 2020-03-31 NOTE — ED Triage Notes (Signed)
The pt [facility] from [facility]  The pt was found on the nursing home floor around 1615 the pt reported that he lost his balance and struck a wall.  1800 the pt was c/o a headache to the staff but when ems arrived the pt was having no pain and he still has no pain on arrival here.  No marks no bruises he takes coumadin  Ems placed a c-collar as a precaution  A and o to person.  Pt has dementia.  He has children that are in [place]

## 2020-03-31 NOTE — ED Provider Notes (Signed)
[facility] EMERGENCY DEPARTMENT Provider Note   CSN: [id number] Arrival date & time: 03/31/20  1939     History Chief Complaint  Patient presents with  . Trauma    Edward Fitzpatrick is a 87 y.o. male.  Patient is a 87 year old male with a history of A. fib on warfarin who presents after a fall.  Patient arrives via EMS.  Per EMS, patient was found on the ground by staff around 1630 this afternoon.  It was an unwitnessed fall.  Patient reports that he backed into the wall and fell to the ground.  It is unclear how long patient was down for. Patient then went back to his daily activities but was complaining of headache a few hours later. Facility called EMS once patient reported headache. Patient reports no other injuries. He denies back pain and neck pain. History is limited due to patient's inability to hear and history of dementia.     History reviewed. No pertinent past medical history.  There are no problems to display for this patient.   History reviewed. No pertinent surgical history.   OB History   No obstetric history on file.     No family history on file.  Social History   Tobacco Use  . Smoking status: Passive Smoke Exposure - Never Smoker  . Smokeless tobacco: Never Used  Substance Use Topics  . Alcohol use: Never    Home Medications Prior to Admission medications   Medication Sig Start Date End Date Taking? Authorizing Provider  azithromycin (ZITHROMAX) 250 MG tablet Take 1 tablet (250 mg total) by mouth daily for 7 days. Take first 2 tablets together, then 1 every day until finished. 03/31/20 04/07/20 Yes Kugler, [place], MD  cefpodoxime (VANTIN) 200 MG tablet Take 1 tablet (200 mg total) by mouth 2 (two) times daily for 7 days. 03/31/20 04/07/20 Yes Kugler, [place], MD    Allergies    Patient has no known allergies.  Review of Systems   Review of Systems  Unable to perform ROS: Dementia    Physical Exam Updated Vital Signs BP (!)  147/82   Pulse 72   Temp 97.8 F (36.6 C)   Resp 17   Ht 5\' 10"  (1.778 m)   Wt 66.7 kg   SpO2 99%   BMI 21.09 kg/m   Physical Exam Vitals and nursing note reviewed.  Constitutional:      General: She is not in acute distress.    Appearance: She is well-developed.  HENT:     Head: Normocephalic and atraumatic.     Right Ear: External ear normal.     Left Ear: External ear normal.     Nose: Nose normal.  Eyes:     General:        Right eye: No discharge.        Left eye: No discharge.  Cardiovascular:     Rate and Rhythm: Normal rate and regular rhythm.  Pulmonary:     Effort: Pulmonary effort is normal.     Breath sounds: Normal breath sounds.  Abdominal:     Palpations: Abdomen is soft.     Tenderness: There is no abdominal tenderness.  Musculoskeletal:        General: No deformity.     Cervical back: Neck supple.     Right lower leg: No edema.     Left lower leg: No edema.  Skin:    General: Skin is warm and dry.  Neurological:  Mental Status: She is alert. She is disoriented.     Comments: Moving all four extremities freely      ED Results / Procedures / Treatments   Labs (all labs ordered are listed, but only abnormal results are displayed) Labs Reviewed  COMPREHENSIVE METABOLIC PANEL - Abnormal; Notable for the following components:      Result Value   Sodium 134 (*)    Glucose, Bld 111 (*)    Creatinine, Ser 1.07 (*)    GFR, Estimated 46 (*)    All other components within normal limits  PROTIME-INR - Abnormal; Notable for the following components:   Prothrombin Time 26.8 (*)    INR 2.6 (*)    All other components within normal limits  URINALYSIS, ROUTINE W REFLEX MICROSCOPIC - Abnormal; Notable for the following components:   Color, Urine COLORLESS (*)    Specific Gravity, Urine 1.004 (*)    Hgb urine dipstick SMALL (*)    All other components within normal limits  I-STAT CHEM 8, ED - Abnormal; Notable for the following components:   BUN 24  (*)    Glucose, Bld 106 (*)    All other components within normal limits  TROPONIN I (HIGH SENSITIVITY) - Abnormal; Notable for the following components:   Troponin I (High Sensitivity) 28 (*)    All other components within normal limits  RESP PANEL BY RT-PCR (FLU A&B, COVID) [id number]  CBC WITH DIFFERENTIAL/PLATELET  CK  ETHANOL  TROPONIN I (HIGH SENSITIVITY)    EKG None  Radiology CT Head Wo Contrast  Result Date: 03/31/2020 CLINICAL DATA:  Pain after trauma EXAM: CT HEAD WITHOUT CONTRAST CT CERVICAL SPINE WITHOUT CONTRAST TECHNIQUE: Multidetector CT imaging of the head and cervical spine was performed following the standard protocol without intravenous contrast. Multiplanar CT image reconstructions of the cervical spine were also generated. COMPARISON:  None. FINDINGS: CT HEAD FINDINGS Brain: No subdural, epidural, or subarachnoid hemorrhage. No mass effect or midline shift. Ventricles and sulci are prominent. Scattered white matter changes are identified. Cerebellum, brainstem, and basal cisterns are normal. No acute cortical ischemia or infarct. Vascular: Calcified atherosclerosis in the intracranial carotids. Skull: Normal. Negative for fracture or focal lesion. Sinuses/Orbits: No acute finding. Other: None. CT CERVICAL SPINE FINDINGS Alignment: Normal. Skull base and vertebrae: No acute fracture. No primary bone lesion or focal pathologic process. Soft tissues and spinal canal: No prevertebral fluid or swelling. No visible canal hematoma. Disc levels:  Mild degenerative changes. Upper chest: Negative. Other: No other abnormalities. IMPRESSION: 1. No acute intracranial abnormalities. 2. No fracture or traumatic malalignment in the cervical spine. Electronically Signed   By: Gerome Sam III M.D   On: 03/31/2020 21:05   CT CHEST W CONTRAST  Result Date: 03/31/2020 CLINICAL DATA:  Rib fracture suspected, traumatic; Abdominal trauma Found on floor.  Patient reports he lost balance and  struck a wall. EXAM: CT CHEST, ABDOMEN, AND PELVIS WITH CONTRAST TECHNIQUE: Multidetector CT imaging of the chest, abdomen and pelvis was performed following the standard protocol during bolus administration of intravenous contrast. CONTRAST:  OMNIPAQUE IOHEXOL 300 MG/ML  SOLN COMPARISON:  Chest and pelvic radiographs earlier today. FINDINGS: CT CHEST FINDINGS Cardiovascular: No evidence of aortic or acute vascular injury. Advanced aortic atherosclerosis and tortuosity. Multi chamber cardiomegaly. Dense coronary artery calcifications. No pericardial fluid. There is an area of peripheral low-density within the left main pulmonary artery, series 3, image 31. This does not have the usual appearance of pulmonary arterial filling defect, and  some kind of artifact is favored, though this is technically indeterminate. This does not follow the course of the pulmonary vessels, and is not seen elsewhere. Mediastinum/Nodes: No mediastinal hemorrhage or hematoma. Decompressed esophagus. No pneumomediastinum. No adenopathy. Lungs/Pleura: Mild patchy peripheral right upper lobe opacity, series 4, image 67. No pneumothorax. Trace right pleural effusion. Calcified granuloma in the right lower lobe. There is dependent lower lobe atelectasis. Lower lobe bronchial thickening. Musculoskeletal: Post median sternotomy without evidence of acute sternal fracture. No acute fracture of the ribs, clavicles, or included shoulder girdles. Multilevel degenerative change throughout the thoracic spine without acute fracture. No confluent chest wall soft tissue abnormality. CT ABDOMEN PELVIS FINDINGS Hepatobiliary: No evidence of hepatic injury or perihepatic hematoma. No focal hepatic abnormality. Layering gallstones within the gallbladder. No gallbladder inflammation. Pancreas: No evidence of injury. No ductal dilatation or inflammation. Spleen: No splenic injury or perisplenic hematoma. Calcified granuloma in the anterior spleen.  Adrenals/Urinary Tract: No adrenal hemorrhage or renal injury identified. No hydronephrosis. Thinning of bilateral renal parenchyma, related age. There are bilateral renal cysts. Small bladder dome diverticulum, bladder otherwise unremarkable. Stomach/Bowel: No evidence of bowel injury. Decompressed stomach. No bowel wall thickening or inflammation. High-riding cecum in the right mid abdomen. Diverticulosis of the descending and sigmoid colon. No diverticulitis. No mesenteric hematoma. Vascular/Lymphatic: No evidence of vascular injury. Abdominal aorta and IVC are intact. Dense aortic atherosclerosis. No adenopathy. Reproductive: Prostate is unremarkable. Other: No free air or free fluid.  No body wall contusion. Musculoskeletal: No acute fracture of the lumbar spine or pelvis. Diffuse degenerative change throughout the spine. IMPRESSION: 1. No evidence of acute traumatic injury to the chest, abdomen, or pelvis. 2. Mild patchy peripheral right upper lobe opacity, may be pneumonia or pulmonary contusion. Trace right pleural effusion. No acute rib fractures. 3. Peripheral low-density in the left main pulmonary artery is favored to represent artifact, however this is technically indeterminate. This does not have the usual appearance of pulmonary arterial filling defect, and does not follow the course of the pulmonary artery. Consider follow-up CT of the chest PE protocol, giving consideration for patient's advanced age, recommend waiting at least 24 hours for re-dosing of IV contrast. 4. Incidental findings in the abdomen and pelvis of colonic diverticulosis without diverticulitis and cholelithiasis. Aortic Atherosclerosis (ICD10-I70.0). Electronically Signed   By: Narda Rutherford M.D.   On: 03/31/2020 21:44   CT Cervical Spine Wo Contrast  Result Date: 03/31/2020 CLINICAL DATA:  Pain after trauma EXAM: CT HEAD WITHOUT CONTRAST CT CERVICAL SPINE WITHOUT CONTRAST TECHNIQUE: Multidetector CT imaging of the head and  cervical spine was performed following the standard protocol without intravenous contrast. Multiplanar CT image reconstructions of the cervical spine were also generated. COMPARISON:  None. FINDINGS: CT HEAD FINDINGS Brain: No subdural, epidural, or subarachnoid hemorrhage. No mass effect or midline shift. Ventricles and sulci are prominent. Scattered white matter changes are identified. Cerebellum, brainstem, and basal cisterns are normal. No acute cortical ischemia or infarct. Vascular: Calcified atherosclerosis in the intracranial carotids. Skull: Normal. Negative for fracture or focal lesion. Sinuses/Orbits: No acute finding. Other: None. CT CERVICAL SPINE FINDINGS Alignment: Normal. Skull base and vertebrae: No acute fracture. No primary bone lesion or focal pathologic process. Soft tissues and spinal canal: No prevertebral fluid or swelling. No visible canal hematoma. Disc levels:  Mild degenerative changes. Upper chest: Negative. Other: No other abnormalities. IMPRESSION: 1. No acute intracranial abnormalities. 2. No fracture or traumatic malalignment in the cervical spine. Electronically Signed   By: Onalee Hua  Mayford Knife III M.D   On: 03/31/2020 21:05   CT ABDOMEN PELVIS W CONTRAST  Result Date: 03/31/2020 CLINICAL DATA:  Rib fracture suspected, traumatic; Abdominal trauma Found on floor.  Patient reports he lost balance and struck a wall. EXAM: CT CHEST, ABDOMEN, AND PELVIS WITH CONTRAST TECHNIQUE: Multidetector CT imaging of the chest, abdomen and pelvis was performed following the standard protocol during bolus administration of intravenous contrast. CONTRAST:  OMNIPAQUE IOHEXOL 300 MG/ML  SOLN COMPARISON:  Chest and pelvic radiographs earlier today. FINDINGS: CT CHEST FINDINGS Cardiovascular: No evidence of aortic or acute vascular injury. Advanced aortic atherosclerosis and tortuosity. Multi chamber cardiomegaly. Dense coronary artery calcifications. No pericardial fluid. There is an area of  peripheral low-density within the left main pulmonary artery, series 3, image 31. This does not have the usual appearance of pulmonary arterial filling defect, and some kind of artifact is favored, though this is technically indeterminate. This does not follow the course of the pulmonary vessels, and is not seen elsewhere. Mediastinum/Nodes: No mediastinal hemorrhage or hematoma. Decompressed esophagus. No pneumomediastinum. No adenopathy. Lungs/Pleura: Mild patchy peripheral right upper lobe opacity, series 4, image 67. No pneumothorax. Trace right pleural effusion. Calcified granuloma in the right lower lobe. There is dependent lower lobe atelectasis. Lower lobe bronchial thickening. Musculoskeletal: Post median sternotomy without evidence of acute sternal fracture. No acute fracture of the ribs, clavicles, or included shoulder girdles. Multilevel degenerative change throughout the thoracic spine without acute fracture. No confluent chest wall soft tissue abnormality. CT ABDOMEN PELVIS FINDINGS Hepatobiliary: No evidence of hepatic injury or perihepatic hematoma. No focal hepatic abnormality. Layering gallstones within the gallbladder. No gallbladder inflammation. Pancreas: No evidence of injury. No ductal dilatation or inflammation. Spleen: No splenic injury or perisplenic hematoma. Calcified granuloma in the anterior spleen. Adrenals/Urinary Tract: No adrenal hemorrhage or renal injury identified. No hydronephrosis. Thinning of bilateral renal parenchyma, related age. There are bilateral renal cysts. Small bladder dome diverticulum, bladder otherwise unremarkable. Stomach/Bowel: No evidence of bowel injury. Decompressed stomach. No bowel wall thickening or inflammation. High-riding cecum in the right mid abdomen. Diverticulosis of the descending and sigmoid colon. No diverticulitis. No mesenteric hematoma. Vascular/Lymphatic: No evidence of vascular injury. Abdominal aorta and IVC are intact. Dense aortic  atherosclerosis. No adenopathy. Reproductive: Prostate is unremarkable. Other: No free air or free fluid.  No body wall contusion. Musculoskeletal: No acute fracture of the lumbar spine or pelvis. Diffuse degenerative change throughout the spine. IMPRESSION: 1. No evidence of acute traumatic injury to the chest, abdomen, or pelvis. 2. Mild patchy peripheral right upper lobe opacity, may be pneumonia or pulmonary contusion. Trace right pleural effusion. No acute rib fractures. 3. Peripheral low-density in the left main pulmonary artery is favored to represent artifact, however this is technically indeterminate. This does not have the usual appearance of pulmonary arterial filling defect, and does not follow the course of the pulmonary artery. Consider follow-up CT of the chest PE protocol, giving consideration for patient's advanced age, recommend waiting at least 24 hours for re-dosing of IV contrast. 4. Incidental findings in the abdomen and pelvis of colonic diverticulosis without diverticulitis and cholelithiasis. Aortic Atherosclerosis (ICD10-I70.0). Electronically Signed   By: Narda Rutherford M.D.   On: 03/31/2020 21:44   DG Pelvis Portable  Result Date: 03/31/2020 CLINICAL DATA:  87 year old post fall. EXAM: PORTABLE PELVIS 1-2 VIEWS COMPARISON:  None. FINDINGS: The bones are subjectively under mineralized. The cortical margins of the bony pelvis are intact. No fracture. Pubic symphysis and sacroiliac joints are  congruent. Both femoral heads are well-seated in the respective acetabula. Bilateral hip osteoarthritis. Advanced vascular calcifications. IMPRESSION: No pelvic fracture. Electronically Signed   By: Narda Rutherford M.D.   On: 03/31/2020 20:01   DG Chest Port 1 View  Result Date: 03/31/2020 CLINICAL DATA:  87 year old status post fall. EXAM: PORTABLE CHEST 1 VIEW COMPARISON:  None. FINDINGS: Post median sternotomy and CABG. Mild cardiomegaly. Atherosclerosis and tortuosity of the thoracic  aorta. No pneumothorax. Bibasilar atelectasis. Possible small pleural effusions. Possible nondisplaced fracture of left lateral fifth rib, age indeterminate. IMPRESSION: 1. Bibasilar atelectasis with possible small pleural effusions. 2. Possible nondisplaced fracture of the left lateral fifth rib, age indeterminate. 3. Cardiomegaly post CABG.  Aortic Atherosclerosis (ICD10-I70.0). Electronically Signed   By: Narda Rutherford M.D.   On: 03/31/2020 20:00    Procedures Procedures   Medications Ordered in ED Medications  iohexol (OMNIPAQUE) 300 MG/ML solution 100 mL (100 mLs Intravenous Contrast Given 03/31/20 2123)  cefdinir (OMNICEF) capsule 300 mg (300 mg Oral Given 03/31/20 2309)  azithromycin (ZITHROMAX) tablet 500 mg (500 mg Oral Given 03/31/20 2308)    ED Course  I have reviewed the triage vital signs and the nursing notes.  Pertinent labs & imaging results that were available during my care of the patient were reviewed by me and considered in my medical decision making (see chart for details).    MDM Rules/Calculators/A&P                          87 year old male on Coumadin who presents after a fall unwitnessed at living facility.  Hemodynamically stable on arrival.  Heart rate appropriate.  Normal blood pressure.  Afebrile and saturating properly on room air.  Trauma scans of CT head, neck, abdomen, chest ordered to evaluate for traumatic injury.  No traumatic injury appreciated.  Patient with a baseline of dementia but is moving all of his extremities freely is alert and awake.  Asking questions about his disposition and what is going on.  Incidental finding of possible pulmonary contusion versus pneumonia on evaluation today.  Patient additionally with mildly elevated troponin.  He denies any shortness of breath or chest pain.  Discussed results with patient and patient's daughter on the phone.  She states that he does not want to be admitted to the hospital.  Per chart review, it  appears the patient has had a gradual decline in his functional capabilities of mental status over the last few months.   He would prefer to go home if medically possible.  Given his age and comorbidities, she reports that his goals of care to be at his living facility if possible.  I feel that this is reasonable to go home and treat patient with outpatient antibiotics.  First dose given here given that he will likely not be able to fill them tonight.  We will have him follow-up with his primary doctor in the next week.  Patient is stable for discharge home at this time.  He is hemodynamically stable.  He will be given first initial dose for community-acquired pneumonia treatment here in the emergency department.  Will Be given Vantin and Zithromax for continued treatment over the next 7 days.  Patient stable for transfer back to his living facility.   Final Clinical Impression(s) / ED Diagnoses Final diagnoses:  Fall, initial encounter  Community acquired pneumonia, unspecified laterality    Rx / DC Orders ED Discharge Orders  Ordered    cefpodoxime (VANTIN) 200 MG tablet  2 times daily        03/31/20 2247    azithromycin (ZITHROMAX) 250 MG tablet  Daily        03/31/20 2247           Kugler, [place], MD 03/31/20 2328    Charlynne PanderYao, David Hsienta, MD 04/02/20 [phone]

## 2020-03-31 NOTE — ED Notes (Signed)
[facility] called  
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
## 2020-03-31 NOTE — ED Notes (Signed)
uinitial bp on arrival was168/81  p 78  Sl irregular

## 2020-03-31 NOTE — ED Notes (Signed)
Daughter Edward Fitzpatrick [phone] would like an update when possible
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
## 2020-03-31 NOTE — ED Notes (Signed)
Pt hard of hearing.
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
## 2020-04-01 LAB — I-STAT CHEM 8, ED
BUN: 24 mg/dL — ABNORMAL HIGH (ref 8–23)
Calcium, Ion: 1.2 mmol/L (ref 1.15–1.40)
Chloride: 104 mmol/L (ref 98–111)
Creatinine, Ser: 1 mg/dL (ref 0.61–1.24)
Glucose, Bld: 106 mg/dL — ABNORMAL HIGH (ref 70–99)
HCT: 37 % — ABNORMAL LOW (ref 39.0–52.0)
Hemoglobin: 12.6 g/dL — ABNORMAL LOW (ref 13.0–17.0)
Potassium: 4.1 mmol/L (ref 3.5–5.1)
Sodium: 139 mmol/L (ref 135–145)
TCO2: 27 mmol/L (ref 22–32)

## 2020-04-01 LAB — CBC WITH DIFFERENTIAL/PLATELET
Abs Immature Granulocytes: 0.02 10*3/uL (ref 0.00–0.07)
Basophils Absolute: 0.1 10*3/uL (ref 0.0–0.1)
Basophils Relative: 1 %
Eosinophils Absolute: 0.2 10*3/uL (ref 0.0–0.5)
Eosinophils Relative: 3 %
HCT: 38.2 % — ABNORMAL LOW (ref 39.0–52.0)
Hemoglobin: 12.7 g/dL — ABNORMAL LOW (ref 13.0–17.0)
Immature Granulocytes: 0 %
Lymphocytes Relative: 17 %
Lymphs Abs: 1.5 10*3/uL (ref 0.7–4.0)
MCH: 30.1 pg (ref 26.0–34.0)
MCHC: 33.2 g/dL (ref 30.0–36.0)
MCV: 90.5 fL (ref 80.0–100.0)
Monocytes Absolute: 0.9 10*3/uL (ref 0.1–1.0)
Monocytes Relative: 11 %
Neutro Abs: 5.9 10*3/uL (ref 1.7–7.7)
Neutrophils Relative %: 68 %
Platelets: 203 10*3/uL (ref 150–400)
RBC: 4.22 MIL/uL (ref 4.22–5.81)
RDW: 14.6 % (ref 11.5–15.5)
WBC: 8.7 10*3/uL (ref 4.0–10.5)
nRBC: 0 % (ref 0.0–0.2)

## 2020-04-01 LAB — COMPREHENSIVE METABOLIC PANEL
ALT: 15 U/L (ref 0–44)
AST: 20 U/L (ref 15–41)
Albumin: 3.7 g/dL (ref 3.5–5.0)
Alkaline Phosphatase: 81 U/L (ref 38–126)
Anion gap: 8 (ref 5–15)
BUN: 21 mg/dL (ref 8–23)
CO2: 25 mmol/L (ref 22–32)
Calcium: 8.9 mg/dL (ref 8.9–10.3)
Chloride: 101 mmol/L (ref 98–111)
Creatinine, Ser: 1.07 mg/dL (ref 0.61–1.24)
GFR, Estimated: 46 mL/min — ABNORMAL LOW (ref >60)
Glucose, Bld: 111 mg/dL — ABNORMAL HIGH (ref 70–99)
Potassium: 3.9 mmol/L (ref 3.5–5.1)
Sodium: 134 mmol/L — ABNORMAL LOW (ref 135–145)
Total Bilirubin: 1.2 mg/dL (ref 0.3–1.2)
Total Protein: 6.7 g/dL (ref 6.5–8.1)

## 2020-04-01 LAB — CK: Total CK: 142 U/L (ref 49–397)
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
## 2020-08-04 ENCOUNTER — Emergency Department (HOSPITAL_COMMUNITY)
Admission: EM | Admit: 2020-08-04 | Discharge: 2020-08-04 | Disposition: A | Discharge status: Home / Self Care | Payer: Medicare HMO | Attending: Emergency Medicine | Admitting: Emergency Medicine

## 2020-08-04 ENCOUNTER — Emergency Department (HOSPITAL_COMMUNITY): Payer: Medicare HMO

## 2020-08-04 DIAGNOSIS — Z7901 Long term (current) use of anticoagulants: Secondary | ICD-10-CM | POA: Insufficient documentation

## 2020-08-04 DIAGNOSIS — Y92129 Unspecified place in nursing home as the place of occurrence of the external cause: Secondary | ICD-10-CM | POA: Insufficient documentation

## 2020-08-04 DIAGNOSIS — S060X9A Concussion with loss of consciousness of unspecified duration, initial encounter: Secondary | ICD-10-CM | POA: Diagnosis not present

## 2020-08-04 DIAGNOSIS — F0390 Unspecified dementia without behavioral disturbance: Secondary | ICD-10-CM | POA: Insufficient documentation

## 2020-08-04 DIAGNOSIS — S80211A Abrasion, right knee, initial encounter: Secondary | ICD-10-CM | POA: Insufficient documentation

## 2020-08-04 DIAGNOSIS — S80212A Abrasion, left knee, initial encounter: Secondary | ICD-10-CM | POA: Diagnosis not present

## 2020-08-04 DIAGNOSIS — S0081XA Abrasion of other part of head, initial encounter: Secondary | ICD-10-CM | POA: Insufficient documentation

## 2020-08-04 DIAGNOSIS — S0990XA Unspecified injury of head, initial encounter: Secondary | ICD-10-CM | POA: Diagnosis present

## 2020-08-04 LAB — BASIC METABOLIC PANEL
Anion gap: 13 (ref 5–15)
BUN: 29 mg/dL — ABNORMAL HIGH (ref 8–23)
CO2: 24 mmol/L (ref 22–32)
Calcium: 9.3 mg/dL (ref 8.9–10.3)
Chloride: 101 mmol/L (ref 98–111)
Creatinine, Ser: 1.19 mg/dL (ref 0.61–1.24)
GFR, Estimated: 54 mL/min — ABNORMAL LOW (ref >60)
Glucose, Bld: 126 mg/dL — ABNORMAL HIGH (ref 70–99)
Potassium: 4.3 mmol/L (ref 3.5–5.1)
Sodium: 138 mmol/L (ref 135–145)

## 2020-08-04 LAB — CBC WITH DIFFERENTIAL/PLATELET
Abs Immature Granulocytes: 0.04 10*3/uL (ref 0.00–0.07)
Basophils Absolute: 0.1 10*3/uL (ref 0.0–0.1)
Basophils Relative: 1 %
Eosinophils Absolute: 0.1 10*3/uL (ref 0.0–0.5)
Eosinophils Relative: 1 %
HCT: 38.6 % — ABNORMAL LOW (ref 39.0–52.0)
Hemoglobin: 12.7 g/dL — ABNORMAL LOW (ref 13.0–17.0)
Immature Granulocytes: 0 %
Lymphocytes Relative: 9 %
Lymphs Abs: 1.1 10*3/uL (ref 0.7–4.0)
MCH: 30.4 pg (ref 26.0–34.0)
MCHC: 32.9 g/dL (ref 30.0–36.0)
MCV: 92.3 fL (ref 80.0–100.0)
Monocytes Absolute: 1.2 10*3/uL — ABNORMAL HIGH (ref 0.1–1.0)
Monocytes Relative: 10 %
Neutro Abs: 9 10*3/uL — ABNORMAL HIGH (ref 1.7–7.7)
Neutrophils Relative %: 79 %
Platelets: 212 10*3/uL (ref 150–400)
RBC: 4.18 MIL/uL — ABNORMAL LOW (ref 4.22–5.81)
RDW: 14.5 % (ref 11.5–15.5)
WBC: 11.4 10*3/uL — ABNORMAL HIGH (ref 4.0–10.5)
nRBC: 0 % (ref 0.0–0.2)

## 2020-08-04 LAB — PROTIME-INR
INR: 2.4 — ABNORMAL HIGH (ref 0.8–1.2)
Prothrombin Time: 26.4 seconds — ABNORMAL HIGH (ref 11.4–15.2)
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
## 2020-08-04 NOTE — ED Notes (Signed)
[facility] notified of pt return via [facility]

## 2020-08-04 NOTE — ED Notes (Signed)
Pt back from CT
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
## 2020-08-04 NOTE — ED Notes (Signed)
[facility] called 
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
## 2021-02-14 DEATH — deceased
